# Patient Record
Sex: Female | Born: 1995
Health system: Southern US, Community
[De-identification: ages and names within clinical notes are randomized; demographics above are authoritative.]

## PROBLEM LIST (undated history)

## (undated) DIAGNOSIS — R109 Unspecified abdominal pain: Secondary | ICD-10-CM

## (undated) DIAGNOSIS — R0602 Shortness of breath: Secondary | ICD-10-CM

## (undated) DIAGNOSIS — R197 Diarrhea, unspecified: Secondary | ICD-10-CM

## (undated) DIAGNOSIS — R002 Palpitations: Secondary | ICD-10-CM

## (undated) DIAGNOSIS — R079 Chest pain, unspecified: Secondary | ICD-10-CM

## (undated) DIAGNOSIS — R011 Cardiac murmur, unspecified: Secondary | ICD-10-CM

## (undated) HISTORY — DX: Chest pain, unspecified: R07.9

## (undated) HISTORY — DX: Unspecified abdominal pain: R10.9

## (undated) HISTORY — DX: Diarrhea, unspecified: R19.7

## (undated) HISTORY — DX: Palpitations: R00.2

## (undated) HISTORY — DX: Shortness of breath: R06.02

## (undated) HISTORY — PX: CHOLECYSTECTOMY: SHX55

---
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
## 2015-12-21 DIAGNOSIS — J309 Allergic rhinitis, unspecified: Secondary | ICD-10-CM | POA: Diagnosis not present

## 2015-12-21 DIAGNOSIS — F411 Generalized anxiety disorder: Secondary | ICD-10-CM | POA: Diagnosis not present

## 2015-12-21 DIAGNOSIS — Z0000 Encounter for general adult medical examination without abnormal findings: Secondary | ICD-10-CM | POA: Diagnosis not present

## 2016-01-16 DIAGNOSIS — Z111 Encounter for screening for respiratory tuberculosis: Secondary | ICD-10-CM | POA: Diagnosis not present

## 2016-01-24 DIAGNOSIS — Z111 Encounter for screening for respiratory tuberculosis: Secondary | ICD-10-CM | POA: Diagnosis not present

## 2016-01-30 DIAGNOSIS — H5712 Ocular pain, left eye: Secondary | ICD-10-CM | POA: Diagnosis not present

## 2016-02-03 DIAGNOSIS — F411 Generalized anxiety disorder: Secondary | ICD-10-CM | POA: Diagnosis not present

## 2016-03-02 DIAGNOSIS — J209 Acute bronchitis, unspecified: Secondary | ICD-10-CM | POA: Diagnosis not present

## 2016-03-02 DIAGNOSIS — J40 Bronchitis, not specified as acute or chronic: Secondary | ICD-10-CM | POA: Diagnosis not present

## 2017-01-14 DIAGNOSIS — Z0000 Encounter for general adult medical examination without abnormal findings: Secondary | ICD-10-CM | POA: Diagnosis not present

## 2017-01-14 DIAGNOSIS — Z23 Encounter for immunization: Secondary | ICD-10-CM | POA: Diagnosis not present

## 2017-01-14 DIAGNOSIS — Z79899 Other long term (current) drug therapy: Secondary | ICD-10-CM | POA: Diagnosis not present

## 2018-02-04 DIAGNOSIS — L245 Irritant contact dermatitis due to other chemical products: Secondary | ICD-10-CM | POA: Diagnosis not present

## 2018-04-11 DIAGNOSIS — Z23 Encounter for immunization: Secondary | ICD-10-CM | POA: Diagnosis not present

## 2018-04-11 DIAGNOSIS — L2389 Allergic contact dermatitis due to other agents: Secondary | ICD-10-CM | POA: Diagnosis not present

## 2018-05-22 DIAGNOSIS — T7840XA Allergy, unspecified, initial encounter: Secondary | ICD-10-CM | POA: Diagnosis not present

## 2018-05-22 DIAGNOSIS — Z0000 Encounter for general adult medical examination without abnormal findings: Secondary | ICD-10-CM | POA: Diagnosis not present

## 2018-06-07 DIAGNOSIS — J209 Acute bronchitis, unspecified: Secondary | ICD-10-CM | POA: Diagnosis not present

## 2018-07-14 DIAGNOSIS — J309 Allergic rhinitis, unspecified: Secondary | ICD-10-CM | POA: Diagnosis not present

## 2018-07-14 DIAGNOSIS — R21 Rash and other nonspecific skin eruption: Secondary | ICD-10-CM | POA: Diagnosis not present

## 2018-07-14 DIAGNOSIS — R05 Cough: Secondary | ICD-10-CM | POA: Diagnosis not present

## 2018-07-24 DIAGNOSIS — T781XXA Other adverse food reactions, not elsewhere classified, initial encounter: Secondary | ICD-10-CM | POA: Diagnosis not present

## 2018-11-20 ENCOUNTER — Other Ambulatory Visit (HOSPITAL_COMMUNITY): Payer: Self-pay | Admitting: General Surgery

## 2018-11-20 DIAGNOSIS — K8080 Other cholelithiasis without obstruction: Secondary | ICD-10-CM

## 2018-11-20 DIAGNOSIS — K8020 Calculus of gallbladder without cholecystitis without obstruction: Secondary | ICD-10-CM | POA: Diagnosis not present

## 2018-12-02 ENCOUNTER — Other Ambulatory Visit: Payer: Self-pay

## 2018-12-02 ENCOUNTER — Encounter (HOSPITAL_COMMUNITY)
Admission: RE | Admit: 2018-12-02 | Discharge: 2018-12-02 | Disposition: A | Discharge status: Home / Self Care | Payer: BLUE CROSS/BLUE SHIELD | Source: Ambulatory Visit | Attending: General Surgery | Admitting: General Surgery

## 2018-12-02 ENCOUNTER — Ambulatory Visit (HOSPITAL_COMMUNITY)
Admission: RE | Admit: 2018-12-02 | Discharge: 2018-12-02 | Disposition: A | Discharge status: Home / Self Care | Payer: BLUE CROSS/BLUE SHIELD | Source: Ambulatory Visit | Attending: General Surgery | Admitting: General Surgery

## 2018-12-02 DIAGNOSIS — K8080 Other cholelithiasis without obstruction: Secondary | ICD-10-CM

## 2018-12-02 DIAGNOSIS — R110 Nausea: Secondary | ICD-10-CM | POA: Diagnosis not present

## 2018-12-02 DIAGNOSIS — K8050 Calculus of bile duct without cholangitis or cholecystitis without obstruction: Secondary | ICD-10-CM | POA: Diagnosis not present

## 2018-12-02 DIAGNOSIS — R140 Abdominal distension (gaseous): Secondary | ICD-10-CM | POA: Diagnosis not present

## 2018-12-02 MED ORDER — TECHNETIUM TC 99M MEBROFENIN IV KIT
5.0000 | PACK | Freq: Once | INTRAVENOUS | Status: AC | PRN
  Administered 2018-12-02: 5 via INTRAVENOUS

## 2018-12-04 ENCOUNTER — Ambulatory Visit: Payer: Self-pay | Admitting: General Surgery

## 2018-12-04 NOTE — H&P (Signed)
History of Present Illness Axel Filler(Teneka Malmberg MD; 11/20/2018 11:03 AM) The patient is a 23 year old female who presents for evaluation of gall stones. Chief Complaint: Abdominal pain  Patient is a 23 year old female otherwise healthy, who comes in with a one-year history of epigastric abdominal pain. Patient had issues with bloating, reflux, diarrhea. Patient states that usually epigastric abdominal pain begins after eating something high in fat. She states that she has recently changed her diet to a [employer] diet. She states this is helping somewhat. Patient states that the episodes of epigastric abdominal pain, nausea, diarrhea is associated with diaphoresis.  Patient has not had any previous imaging. Patient's had a previous abdominal surgery.    Past Surgical History Rosezella [place](Diane Herrin, RN; 11/20/2018 10:41 AM) Foot Surgery  Left. Oral Surgery   Diagnostic Studies History Rosezella [place](Diane Herrin, RN; 11/20/2018 10:41 AM) Colonoscopy  never Mammogram  never Pap Smear  never  Allergies Rosezella Florida(Diane Herrin, RN; 11/20/2018 10:44 AM) PCN-200 *MISCELLANEOUS THERAPEUTIC CLASSES*  Rash. Betadine *ANTISEPTICS & DISINFECTANTS*  Rash, Pruritus, Hives. and CHG as weel, patient is a scrub tech  Medication History (Diane Herrin, RN; 11/20/2018 10:45 AM) Sprintec 28 (0.25-35MG -MCG Tablet, Oral) Active. Lexapro (5MG /5ML Solution, Oral) Active. ZyrTEC Allergy (10MG  Capsule, Oral) Active. Medications Reconciled  Social History Rosezella Florida(Diane Herrin, RN; 11/20/2018 10:41 AM) Alcohol use  Occasional alcohol use. Caffeine use  Carbonated beverages. No drug use  Tobacco use  Never smoker.  Family History Rosezella Florida(Diane Herrin, RN; 11/20/2018 10:41 AM) Cancer  Father. Hypertension  Father.  Pregnancy / Birth History Rosezella Florida(Diane Herrin, RN; 11/20/2018 10:41 AM) Age at menarche  13 years. Contraceptive History  Oral contraceptives. Regular periods   Other Problems Rosezella Florida(Diane Herrin, RN; 11/20/2018 10:41 AM) Anxiety Disorder      Review of Systems Axel Filler(Xoie Kreuser MD; 11/20/2018 11:01 AM) General Present- Appetite Loss, Chills, Fatigue and Weight Loss. Not Present- Fever, Night Sweats and Weight Gain. Skin Not Present- Change in Wart/Mole, Dryness, Hives, Jaundice, New Lesions, Non-Healing Wounds, Rash and Ulcer. HEENT Not Present- Earache, Hearing Loss, Hoarseness, Nose Bleed, Oral Ulcers, Ringing in the Ears, Seasonal Allergies, Sinus Pain, Sore Throat, Visual Disturbances, Wears glasses/contact lenses and Yellow Eyes. Respiratory Not Present- Bloody sputum, Chronic Cough, Difficulty Breathing, Snoring and Wheezing. Breast Not Present- Breast Mass, Breast Pain, Nipple Discharge and Skin Changes. Cardiovascular Present- Rapid Heart Rate. Not Present- Chest Pain, Difficulty Breathing Lying Down, Leg Cramps, Palpitations, Shortness of Breath and Swelling of Extremities. Gastrointestinal Present- Abdominal Pain, Bloating, Change in Bowel Habits, Excessive gas, Gets full quickly at meals, Indigestion, Nausea and Vomiting. Not Present- Bloody Stool, Chronic diarrhea, Constipation, Difficulty Swallowing, Hemorrhoids and Rectal Pain. Female Genitourinary Not Present- Frequency, Nocturia, Painful Urination, Pelvic Pain and Urgency. Musculoskeletal Not Present- Back Pain, Joint Pain, Joint Stiffness, Muscle Pain, Muscle Weakness and Swelling of Extremities. Neurological Not Present- Decreased Memory, Fainting, Headaches, Numbness, Seizures, Tingling, Tremor, Trouble walking and Weakness. Psychiatric Not Present- Anxiety, Bipolar, Change in Sleep Pattern, Depression, Fearful and Frequent crying. Endocrine Not Present- Cold Intolerance, Excessive Hunger, Hair Changes, Heat Intolerance, Hot flashes and New Diabetes. Hematology Not Present- Blood Thinners, Easy Bruising, Excessive bleeding, Gland problems, HIV and Persistent Infections. All other systems negative  Vitals (Diane Herrin RN; 11/20/2018 10:45 AM) 11/20/2018 10:45  AM Weight: 145.38 lb Height: 65in Body Surface Area: 1.73 m Body Mass Index: 24.19 kg/m  Temp.: 98.10F(Oral)  Pulse: 78 (Regular)  P.OX: 98% (Room air) BP: 112/66 (Sitting, Left Arm, Standard)       Physical Exam Axel Filler(Totiana Everson MD;  11/20/2018 11:03 AM) The physical exam findings are as follows: Note:Constitutional: No acute distress, conversant, appears stated age  Eyes: Anicteric sclerae, moist conjunctiva, no lid lag  Neck: No thyromegaly, trachea midline, no cervical lymphadenopathy  Lungs: Clear to auscultation biilaterally, normal respiratory effot  Cardiovascular: regular rate & rhythm, no murmurs, no peripheal edema, pedal pulses 2+  GI: Soft, no masses or hepatosplenomegaly, non-tender to palpation  MSK: Normal gait, no clubbing cyanosis, edema  Skin: No rashes, palpation reveals normal skin turgor  Psychiatric: Appropriate judgment and insight, oriented to person, place, and time    Assessment & Plan Axel Filler MD; 11/20/2018 11:04 AM) SYMPTOMATIC CHOLELITHIASIS (K80.20) Impression: 23 year old female with likely symptomatic cholelithiasis versus biliary dyskinesia. 1. Will order a HIDA scan ultrasound. We'll call her back with the results. 2. I did discuss with her the surgery in detail. 3. Risks and benefits were discussed with the patient to generally include, but not limited to: infection, bleeding, possible need for post op ERCP, damage to the bile ducts, bile leak, and possible need for further surgery. Alternatives were offered and described. All questions were answered and the patient voiced understanding of the procedure and wishes to proceed at this point with a laparoscopic cholecystectomy

## 2019-01-08 DIAGNOSIS — Z1159 Encounter for screening for other viral diseases: Secondary | ICD-10-CM | POA: Diagnosis not present

## 2019-01-13 DIAGNOSIS — K811 Chronic cholecystitis: Secondary | ICD-10-CM | POA: Diagnosis not present

## 2019-01-13 DIAGNOSIS — K828 Other specified diseases of gallbladder: Secondary | ICD-10-CM | POA: Diagnosis not present

## 2019-03-02 DIAGNOSIS — H6123 Impacted cerumen, bilateral: Secondary | ICD-10-CM | POA: Diagnosis not present

## 2019-04-23 DIAGNOSIS — Z01419 Encounter for gynecological examination (general) (routine) without abnormal findings: Secondary | ICD-10-CM | POA: Diagnosis not present

## 2019-04-23 DIAGNOSIS — Z6824 Body mass index (BMI) 24.0-24.9, adult: Secondary | ICD-10-CM | POA: Diagnosis not present

## 2019-04-23 DIAGNOSIS — Z113 Encounter for screening for infections with a predominantly sexual mode of transmission: Secondary | ICD-10-CM | POA: Diagnosis not present

## 2019-06-09 DIAGNOSIS — Z79899 Other long term (current) drug therapy: Secondary | ICD-10-CM | POA: Diagnosis not present

## 2019-06-09 DIAGNOSIS — Z0000 Encounter for general adult medical examination without abnormal findings: Secondary | ICD-10-CM | POA: Diagnosis not present

## 2019-06-09 DIAGNOSIS — Z13220 Encounter for screening for lipoid disorders: Secondary | ICD-10-CM | POA: Diagnosis not present

## 2019-08-12 DIAGNOSIS — Z3181 Encounter for male factor infertility in female patient: Secondary | ICD-10-CM | POA: Diagnosis not present

## 2019-08-12 DIAGNOSIS — N978 Female infertility of other origin: Secondary | ICD-10-CM | POA: Diagnosis not present

## 2019-08-20 DIAGNOSIS — Z3181 Encounter for male factor infertility in female patient: Secondary | ICD-10-CM | POA: Diagnosis not present

## 2019-08-20 DIAGNOSIS — N978 Female infertility of other origin: Secondary | ICD-10-CM | POA: Diagnosis not present

## 2019-11-24 DIAGNOSIS — L858 Other specified epidermal thickening: Secondary | ICD-10-CM | POA: Diagnosis not present

## 2019-11-24 DIAGNOSIS — D485 Neoplasm of uncertain behavior of skin: Secondary | ICD-10-CM | POA: Diagnosis not present

## 2019-11-24 DIAGNOSIS — D2262 Melanocytic nevi of left upper limb, including shoulder: Secondary | ICD-10-CM | POA: Diagnosis not present

## 2019-11-24 DIAGNOSIS — D225 Melanocytic nevi of trunk: Secondary | ICD-10-CM | POA: Diagnosis not present

## 2019-11-24 DIAGNOSIS — D2261 Melanocytic nevi of right upper limb, including shoulder: Secondary | ICD-10-CM | POA: Diagnosis not present

## 2019-11-24 DIAGNOSIS — D224 Melanocytic nevi of scalp and neck: Secondary | ICD-10-CM | POA: Diagnosis not present

## 2020-01-13 DIAGNOSIS — R110 Nausea: Secondary | ICD-10-CM | POA: Diagnosis not present

## 2020-01-13 DIAGNOSIS — R197 Diarrhea, unspecified: Secondary | ICD-10-CM | POA: Diagnosis not present

## 2020-01-13 DIAGNOSIS — K529 Noninfective gastroenteritis and colitis, unspecified: Secondary | ICD-10-CM | POA: Diagnosis not present

## 2020-02-12 DIAGNOSIS — Z3169 Encounter for other general counseling and advice on procreation: Secondary | ICD-10-CM | POA: Diagnosis not present

## 2020-03-06 ENCOUNTER — Encounter (HOSPITAL_COMMUNITY): Payer: Self-pay | Admitting: Emergency Medicine

## 2020-03-06 ENCOUNTER — Emergency Department (HOSPITAL_COMMUNITY)
Admission: EM | Admit: 2020-03-06 | Discharge: 2020-03-06 | Disposition: A | Discharge status: Home / Self Care | Payer: BC Managed Care – PPO | Attending: Emergency Medicine | Admitting: Emergency Medicine

## 2020-03-06 ENCOUNTER — Emergency Department (HOSPITAL_COMMUNITY): Payer: BC Managed Care – PPO

## 2020-03-06 ENCOUNTER — Other Ambulatory Visit: Payer: Self-pay

## 2020-03-06 DIAGNOSIS — R002 Palpitations: Secondary | ICD-10-CM | POA: Diagnosis not present

## 2020-03-06 DIAGNOSIS — R109 Unspecified abdominal pain: Secondary | ICD-10-CM | POA: Insufficient documentation

## 2020-03-06 DIAGNOSIS — R197 Diarrhea, unspecified: Secondary | ICD-10-CM | POA: Diagnosis not present

## 2020-03-06 DIAGNOSIS — R0789 Other chest pain: Secondary | ICD-10-CM | POA: Diagnosis not present

## 2020-03-06 DIAGNOSIS — R079 Chest pain, unspecified: Secondary | ICD-10-CM | POA: Diagnosis not present

## 2020-03-06 HISTORY — DX: Cardiac murmur, unspecified: R01.1

## 2020-03-06 LAB — BASIC METABOLIC PANEL
Anion gap: 10 (ref 5–15)
BUN: 5 mg/dL — ABNORMAL LOW (ref 6–20)
CO2: 23 mmol/L (ref 22–32)
Calcium: 9.4 mg/dL (ref 8.9–10.3)
Chloride: 104 mmol/L (ref 98–111)
Creatinine, Ser: 0.79 mg/dL (ref 0.44–1.00)
GFR calc Af Amer: >60 mL/min (ref >60)
GFR calc non Af Amer: >60 mL/min (ref >60)
Glucose, Bld: 83 mg/dL (ref 70–99)
Potassium: 4.1 mmol/L (ref 3.5–5.1)
Sodium: 137 mmol/L (ref 135–145)

## 2020-03-06 LAB — CBC
HCT: 42.5 % (ref 36.0–46.0)
Hemoglobin: 13.4 g/dL (ref 12.0–15.0)
MCH: 28.8 pg (ref 26.0–34.0)
MCHC: 31.5 g/dL (ref 30.0–36.0)
MCV: 91.2 fL (ref 80.0–100.0)
Platelets: 309 10*3/uL (ref 150–400)
RBC: 4.66 MIL/uL (ref 3.87–5.11)
RDW: 12.9 % (ref 11.5–15.5)
WBC: 7.4 10*3/uL (ref 4.0–10.5)
nRBC: 0 % (ref 0.0–0.2)

## 2020-03-06 LAB — D-DIMER, QUANTITATIVE: D-Dimer, Quant: <0.27 ug/mL-FEU (ref 0.00–0.50)

## 2020-03-06 LAB — TROPONIN I (HIGH SENSITIVITY)
Troponin I (High Sensitivity): <2 ng/L (ref <18)
Troponin I (High Sensitivity): 2 ng/L (ref <18)

## 2020-03-06 LAB — I-STAT BETA HCG BLOOD, ED (MC, WL, AP ONLY): I-stat hCG, quantitative: <5 m[IU]/mL (ref <5)

## 2020-03-06 LAB — TSH: TSH: 2.726 u[IU]/mL (ref 0.350–4.500)

## 2020-03-06 NOTE — ED Triage Notes (Signed)
Pt states her heart started racing while in church, chest tightness, palms were sweaty, nausea, and diarrhea.  Received 1st COVID vaccine on Tuesday.  She put her husband's apple watch on and HR was fluctuating between 70-140.

## 2020-03-06 NOTE — Discharge Instructions (Addendum)
You were evaluated in the Emergency Department and after careful evaluation, we did not find any emergent condition requiring admission or further testing in the hospital.  Your exam/testing today is overall reassuring.  As discussed, if your symptoms continue we recommend follow-up with cardiology for further recommendations and possibly more extended cardiac monitoring.  Please call the office number provided to schedule an appointment.  Please return to the Emergency Department if you experience any worsening of your condition.   Thank you for allowing [place] to be a part of your care.

## 2020-03-06 NOTE — ED Provider Notes (Signed)
[facility] Emergency Department Provider Note MRN:  [id number]  Arrival date & time: 03/06/20     Chief Complaint   heart racing   History of Present Illness   Angela Rogers is a 24 y.o. year-old female with no pertinent past medical history presenting to the ED with chief complaint of heart racing.  Patient was at church this morning when she experienced sudden onset palpitations, chest pain, abdominal cramping, followed by diarrhea.  She continues to have on and off palpitations and chest discomfort with shortness of breath.  Denies headache or vision change, no recent fever or cough, no lower abdominal pain, no vaginal bleeding or discharge, no numbness or weakness to the arms or legs.  Review of Systems  A complete 10 system review of systems was obtained and all systems are negative except as noted in the HPI and PMH.   Patient's Health History    Past Medical History:  Diagnosis Date  . Heart murmur     Past Surgical History:  Procedure Laterality Date  . CHOLECYSTECTOMY      No family history on file.  Social History   Socioeconomic History  . Marital status: Single    Spouse name: Not on file  . Number of children: Not on file  . Years of education: Not on file  . Highest education level: Not on file  Occupational History  . Not on file  Tobacco Use  . Smoking status: Never Smoker  . Smokeless tobacco: Never Used  Substance and Sexual Activity  . Alcohol use: Never  . Drug use: Never  . Sexual activity: Not on file  Other Topics Concern  . Not on file  Social History Narrative  . Not on file   Social Determinants of Health   Financial Resource Strain:   . Difficulty of Paying Living Expenses: Not on file  Food Insecurity:   . Worried About Programme researcher, broadcasting/film/video in the Last Year: Not on file  . Ran Out of Food in the Last Year: Not on file  Transportation Needs:   . Lack of Transportation (Medical): Not on file  . Lack  of Transportation (Non-Medical): Not on file  Physical Activity:   . Days of Exercise per Week: Not on file  . Minutes of Exercise per Session: Not on file  Stress:   . Feeling of Stress : Not on file  Social Connections:   . Frequency of Communication with Friends and Family: Not on file  . Frequency of Social Gatherings with Friends and Family: Not on file  . Attends Religious Services: Not on file  . Active Member of Clubs or Organizations: Not on file  . Attends Banker Meetings: Not on file  . Marital Status: Not on file  Intimate Partner Violence:   . Fear of Current or Ex-Partner: Not on file  . Emotionally Abused: Not on file  . Physically Abused: Not on file  . Sexually Abused: Not on file     Physical Exam   Vitals:   03/06/20 1808 03/06/20 1930  BP: 127/63 (!) 106/94  Pulse: 77 75  Resp: 19 (!) 23  Temp: 98.3 F (36.8 C)   SpO2: 100% 100%    CONSTITUTIONAL: Well-appearing, NAD NEURO:  Alert and oriented x 3, no focal deficits EYES:  eyes equal and reactive ENT/NECK:  no LAD, no JVD CARDIO: Regular rate, well-perfused, normal S1 and S2 PULM:  CTAB no wheezing or rhonchi GI/GU:  normal bowel sounds, non-distended, non-tender MSK/SPINE:  No gross deformities, no edema SKIN:  no rash, atraumatic PSYCH:  Appropriate speech and behavior  *Additional and/or pertinent findings included in MDM below  Diagnostic and Interventional Summary    EKG Interpretation  Date/Time:  Sunday March 06 2020 12:33:00 EDT Ventricular Rate:  94 PR Interval:  150 QRS Duration: 88 QT Interval:  362 QTC Calculation: 452 R Axis:   78 Text Interpretation: Normal sinus rhythm Normal ECG No significant change was found Confirmed by Kennis Carina [phone]) on 03/06/2020 7:44:20 PM      Labs Reviewed  BASIC METABOLIC PANEL - Abnormal; Notable for the following components:      Result Value   BUN 5 (*)    All other components within normal limits  CBC  D-DIMER,  QUANTITATIVE (NOT AT [facility])  TSH  I-STAT BETA HCG BLOOD, ED ([facility], [facility], [facility] ONLY)  TROPONIN I (HIGH SENSITIVITY)  TROPONIN I (HIGH SENSITIVITY)    DG Chest 2 View  Final Result      Medications - No data to display   Procedures  /  Critical Care Procedures  ED Course and Medical Decision Making  I have reviewed the triage vital signs, the nursing notes, and pertinent available records from the EMR.  Listed above are laboratory and imaging tests that I personally ordered, reviewed, and interpreted and then considered in my medical decision making (see below for details).  Palpitations, chest pain.  Atypical, otherwise healthy female.  Takes birth control pills, recent flight to [place].  Will screen with D-dimer.  D-dimer negative, TSH normal, she continues to look well with no acute distress, normal vital signs, no arrhythmia on cardiac monitoring.  She is appropriate for discharge, advised cardiology follow-up if symptoms continue.       Elmer Sow. Pilar Plate, MD [facility] Emergency Medicine [facility] [email or web address]  Final Clinical Impressions(s) / ED Diagnoses     ICD-10-CM   1. Chest pain, unspecified type  R07.9   2. Palpitations  R00.2     ED Discharge Orders    None       Discharge Instructions Discussed with and Provided to Patient:     Discharge Instructions     You were evaluated in the Emergency Department and after careful evaluation, we did not find any emergent condition requiring admission or further testing in the hospital.  Your exam/testing today is overall reassuring.  As discussed, if your symptoms continue we recommend follow-up with cardiology for further recommendations and possibly more extended cardiac monitoring.  Please call the office number provided to schedule an appointment.  Please return to the Emergency Department if you experience any worsening of your condition.   Thank you for allowing [place] to be a part of your  care.       Sabas Sous, MD 03/06/20 2241

## 2020-03-17 NOTE — Progress Notes (Signed)
Cardiology Office Note:    Date:  03/18/2020   ID:  Raford Pitcher, DOB 20-May-1996, MRN [id number]  PCP:  Lupita Raider, MD  Cardiologist:  No primary care provider on file.   Referring MD: Lupita Raider, MD   Chief Complaint  Patient presents with  . Irregular Heart Beat    racing heart  . Advice Only    Near syncope    History of Present Illness:    Angela Rogers is a 24 y.o. female with a hx of chest pain for clinical and diagnostic evaluation.  On the last Sunday in office she started feeling odd at church.  She could feel her heart racing, developed nausea, diaphoresis, and felt generally bad.  She left the sanctuary followed by her parents.  They noted increases in the heart rate with sudden decreases in heart rate.  She ultimately developed nausea and an urge to defecate followed by diarrhea.  The heart rate continued to spike up and down.  She was eventually seen in the emergency room at [facility].  Significant blood work and monitoring was done without finding a particular answer.  TSH was normal, D-dimer was normal, O2 saturations were normal.  Past Medical History:  Diagnosis Date  . Abdominal cramping   . Chest pain   . Diarrhea   . Heart murmur   . Palpitations   . SOB (shortness of breath)     Past Surgical History:  Procedure Laterality Date  . CHOLECYSTECTOMY      Current Medications: Current Meds  Medication Sig  . cetirizine (ZYRTEC) 10 MG tablet Take 10 mg by mouth daily as needed for allergies.  Marland Kitchen escitalopram (LEXAPRO) 5 MG tablet Take 5 mg by mouth at bedtime.     Allergies:   Penicillins, Chlorhexidine, and Povidone iodine   Social History   Socioeconomic History  . Marital status: Single    Spouse name: Not on file  . Number of children: Not on file  . Years of education: Not on file  . Highest education level: Not on file  Occupational History  . Not on file  Tobacco Use  . Smoking status: Never Smoker  .  Smokeless tobacco: Never Used  Substance and Sexual Activity  . Alcohol use: Never  . Drug use: Never  . Sexual activity: Not on file  Other Topics Concern  . Not on file  Social History Narrative  . Not on file   Social Determinants of Health   Financial Resource Strain:   . Difficulty of Paying Living Expenses: Not on file  Food Insecurity:   . Worried About Programme researcher, broadcasting/film/video in the Last Year: Not on file  . Ran Out of Food in the Last Year: Not on file  Transportation Needs:   . Lack of Transportation (Medical): Not on file  . Lack of Transportation (Non-Medical): Not on file  Physical Activity:   . Days of Exercise per Week: Not on file  . Minutes of Exercise per Session: Not on file  Stress:   . Feeling of Stress : Not on file  Social Connections:   . Frequency of Communication with Friends and Family: Not on file  . Frequency of Social Gatherings with Friends and Family: Not on file  . Attends Religious Services: Not on file  . Active Member of Clubs or Organizations: Not on file  . Attends Banker Meetings: Not on file  . Marital Status: Not on file  Family History: The patient's family history is not on file.  ROS:   Please see the history of present illness.    Anxious about her current symptoms and problems.  She is worried about (explanation.  P.  All other systems reviewed and are negative.  EKGs/Labs/Other Studies Reviewed:    The following studies were reviewed today: All data from the [facility] Emergency room visit was reviewed.  TSH was normal.  D-dimer was normal.  Hemoglobin is normal.  Troponin Is normal as is kidney function and potassium.  EKG:  EKG electrocardiograms done 03/06/2020 on presentation to the ER revealed incomplete right bundle and sinus rhythm.  Recent Labs: 03/06/2020: BUN 5; Creatinine, Ser 0.79; Hemoglobin 13.4; Platelets 309; Potassium 4.1; Sodium 137; TSH 2.726  Recent Lipid Panel No results found for: CHOL,  TRIG, HDL, CHOLHDL, VLDL, LDLCALC, LDLDIRECT  Physical Exam:    VS:  BP 110/72   Pulse 79   Ht 5\' 6"  (1.676 m)   Wt 153 lb (69.4 kg)   LMP 03/02/2020   SpO2 99%   BMI 24.69 kg/m     Wt Readings from Last 3 Encounters:  03/18/20 153 lb (69.4 kg)     GEN: Appears healthy. No acute distress HEENT: Normal NECK: No JVD. LYMPHATICS: No lymphadenopathy CARDIAC: 1-2 6 right upper sternal systolic murmur.  No diastolic murmur.  RRR with standing no murmur, gallop, or edema. VASCULAR:  Normal Pulses. No bruits. RESPIRATORY:  Clear to auscultation without rales, wheezing or rhonchi  ABDOMEN: Soft, non-tender, non-distended, No pulsatile mass, MUSCULOSKELETAL: No deformity  SKIN: Warm and dry NEUROLOGIC:  Alert and oriented x 3 PSYCHIATRIC:  Normal affect   ASSESSMENT:    1. Atrial tachycardia (HCC)   2. Murmur   3. Near syncope   4. Educated about COVID-19 virus infection   5. Palpitations    PLAN:    In order of problems listed above:  1. 30-day monitor to rule out A. fib, PSVT, or other significant arrhythmia.  Hyperthyroidism has been excluded.  Consider pheochromocytoma if appropriate when additional database has been collected. 2. 2D Doppler echocardiogram to investigate the structural integrity of the heart 3. Will see if the Mylanta has any correlation with spells of near fainting that she is having.  Possibly a neurally mediated mechanism given the GI component. 4. Advised her to get the second dose of the Pfizer COVID-19 mRNA vaccine.   Medication Adjustments/Labs and Tests Ordered: Current medicines are reviewed at length with the patient today.  Concerns regarding medicines are outlined above.  Orders Placed This Encounter  Procedures  . Cardiac event monitor  . ECHOCARDIOGRAM COMPLETE   No orders of the defined types were placed in this encounter.   Patient Instructions  Medication Instructions:  Your physician recommends that you continue on your current  medications as directed. Please refer to the Current Medication list given to you today.  *If you need a refill on your cardiac medications before your next appointment, please call your pharmacy*   Lab Work: None If you have labs (blood work) drawn today and your tests are completely normal, you will receive your results only by: 05/18/20 MyChart Message (if you have MyChart) OR . A paper copy in the mail If you have any lab test that is abnormal or we need to change your treatment, we will call you to review the results.   Testing/Procedures: Your physician has requested that you have an echocardiogram. Echocardiography is a painless test that  uses sound waves to create images of your heart. It provides your doctor with information about the size and shape of your heart and how well your heart's chambers and valves are working. This procedure takes approximately one hour. There are no restrictions for this procedure.  Your physician has recommended that you wear an event monitor. Event monitors are medical devices that record the heart's electrical activity. Doctors most often [place] these monitors to diagnose arrhythmias. Arrhythmias are problems with the speed or rhythm of the heartbeat. The monitor is a small, portable device. You can wear one while you do your normal daily activities. This is usually used to diagnose what is causing palpitations/syncope (passing out).    Follow-Up: At [facility], you and your health needs are our priority.  As part of our continuing mission to provide you with exceptional heart care, we have created designated Provider Care Teams.  These Care Teams include your primary Cardiologist (physician) and Advanced Practice Providers (APPs -  Physician Assistants and Nurse Practitioners) who all work together to provide you with the care you need, when you need it.  We recommend signing up for the patient portal called "MyChart".  Sign up information is provided on this  After Visit Summary.  MyChart is used to connect with patients for Virtual Visits (Telemedicine).  Patients are able to view lab/test results, encounter notes, upcoming appointments, etc.  Non-urgent messages can be sent to your provider as well.   To learn more about what you can do with MyChart, go to [email or web address].    Your next appointment:   As needed  The format for your next appointment:   In Person  Provider:   You may see Dr. Verdis Prime or one of the following Advanced Practice Providers on your designated Care Team:    Norma Fredrickson, NP  Nada Boozer, NP  Georgie Chard, NP    Other Instructions  [employer] Cardiac Event Monitor Instructions Your physician has requested you wear your cardiac event monitor for 30 days. [employer] may call or text to confirm a shipping address. The monitor will be sent to a land address via UPS. [employer] will not ship a monitor to a PO BOX. It typically takes 3-5 days to receive your monitor after it has been enrolled. [employer] will assist with USPS tracking if your package is delayed. The telephone number for [employer] is [phone]. Once you have received your monitor, please review the enclosed instructions. Instruction tutorials can also be viewed under help and settings on the enclosed cell phone. Your monitor has already been registered assigning a specific monitor serial # to you.  Applying the monitor Remove cell phone from case and turn it on. The cell phone works as IT consultant and needs to be within [employer] of you at all times. The cell phone will need to be charged on a daily basis. We recommend you plug the cell phone into the enclosed charger at your bedside table every night.  Monitor batteries: You will receive two monitor batteries labelled #1 and #2. These are your recorders. Plug battery #2 onto the second connection on the enclosed charger. Keep one battery on the charger at all times. This  will keep the monitor battery deactivated. It will also keep it fully charged for when you need to switch your monitor batteries. A small light will be blinking on the battery emblem when it is charging. The light on the battery emblem will remain on when the battery is fully  charged.  Open package of a Monitor strip. Insert battery #1 into black hood on strip and gently squeeze monitor battery onto connection as indicated in instruction booklet. Set aside while preparing skin.  Choose location for your strip, vertical or horizontal, as indicated in the instruction booklet. Shave to remove all hair from location. There cannot be any lotions, oils, powders, or colognes on skin where monitor is to be applied. Wipe skin clean with enclosed Saline wipe. Dry skin completely.  Peel paper labeled #1 off the back of the Monitor strip exposing the adhesive. Place the monitor on the chest in the vertical or horizontal position shown in the instruction booklet. One arrow on the monitor strip must be pointing upward. Carefully remove paper labeled #2, attaching remainder of strip to your skin. Try not to create any folds or wrinkles in the strip as you apply it.  Firmly press and release the circle in the center of the monitor battery. You will hear a small beep. This is turning the monitor battery on. The heart emblem on the monitor battery will light up every 5 seconds if the monitor battery in turned on and connected to the patient securely. Do not push and hold the circle down as this turns the monitor battery off. The cell phone will locate the monitor battery. A screen will appear on the cell phone checking the connection of your monitor strip. This may read poor connection initially but change to good connection within the next minute. Once your monitor accepts the connection you will hear a series of 3 beeps followed by a climbing crescendo of beeps. A screen will appear on the cell phone showing  the two monitor strip placement options. Touch the picture that demonstrates where you applied the monitor strip.  Your monitor strip and battery are waterproof. You are able to shower, bathe, or swim with the monitor on. They just ask you do not submerge deeper than 3 feet underwater. We recommend removing the monitor if you are swimming in a lake, river, or ocean.  Your monitor battery will need to be switched to a fully charged monitor battery approximately once a week. The cell phone will alert you of an action which needs to be made.  On the cell phone, tap for details to reveal connection status, monitor battery status, and cell phone battery status. The green dots indicates your monitor is in good status. A red dot indicates there is something that needs your attention.  To record a symptom, click the circle on the monitor battery. In 30-60 seconds a list of symptoms will appear on the cell phone. Select your symptom and tap save. Your monitor will record a sustained or significant arrhythmia regardless of you clicking the button. Some patients do not feel the heart rhythm irregularities. [employer] will notify us of any serious or critical events.  Refer to instruction booklet for instructions on switching batteries, changing strips, the Do not disturb or Pause features, or any additional questions.  Call [employer] at [phone], to confirm your monitor is transmitting and record your baseline. They will answer any questions you may have regarding the monitor instructions at that time.  Returning the monitor to [employer] Place all equipment back into blue box. Peel off strip of paper to expose adhesive and close box securely. There is a prepaid UPS shipping label on this box. Drop in a UPS drop box, or at a UPS facility like [employer]. You may also contact [employer] to arrange UPS  to pick up monitor package at your home.     Signed, Lesleigh Noe, MD  03/18/2020 5:29  PM    [facility]

## 2020-03-18 ENCOUNTER — Ambulatory Visit: Payer: BC Managed Care – PPO | Admitting: Interventional Cardiology

## 2020-03-18 ENCOUNTER — Other Ambulatory Visit: Payer: Self-pay

## 2020-03-18 ENCOUNTER — Encounter: Payer: Self-pay | Admitting: Interventional Cardiology

## 2020-03-18 ENCOUNTER — Encounter: Payer: Self-pay | Admitting: *Deleted

## 2020-03-18 ENCOUNTER — Telehealth: Payer: Self-pay | Admitting: Radiology

## 2020-03-18 VITALS — BP 110/72 | HR 79 | Ht 66.0 in | Wt 153.0 lb

## 2020-03-18 DIAGNOSIS — R011 Cardiac murmur, unspecified: Secondary | ICD-10-CM

## 2020-03-18 DIAGNOSIS — Z7189 Other specified counseling: Secondary | ICD-10-CM

## 2020-03-18 DIAGNOSIS — I471 Supraventricular tachycardia: Secondary | ICD-10-CM | POA: Diagnosis not present

## 2020-03-18 DIAGNOSIS — R55 Syncope and collapse: Secondary | ICD-10-CM

## 2020-03-18 DIAGNOSIS — R002 Palpitations: Secondary | ICD-10-CM

## 2020-03-18 NOTE — Patient Instructions (Signed)
Medication Instructions:  Your physician recommends that you continue on your current medications as directed. Please refer to the Current Medication list given to you today.  *If you need a refill on your cardiac medications before your next appointment, please call your pharmacy*   Lab Work: None If you have labs (blood work) drawn today and your tests are completely normal, you will receive your results only by: Marland Kitchen MyChart Message (if you have MyChart) OR . A paper copy in the mail If you have any lab test that is abnormal or we need to change your treatment, we will call you to review the results.   Testing/Procedures: Your physician has requested that you have an echocardiogram. Echocardiography is a painless test that uses sound waves to create images of your heart. It provides your doctor with information about the size and shape of your heart and how well your heart's chambers and valves are working. This procedure takes approximately one hour. There are no restrictions for this procedure.  Your physician has recommended that you wear an event monitor. Event monitors are medical devices that record the heart's electrical activity. Doctors most often [place] these monitors to diagnose arrhythmias. Arrhythmias are problems with the speed or rhythm of the heartbeat. The monitor is a small, portable device. You can wear one while you do your normal daily activities. This is usually used to diagnose what is causing palpitations/syncope (passing out).    Follow-Up: At [facility], you and your health needs are our priority.  As part of our continuing mission to provide you with exceptional heart care, we have created designated Provider Care Teams.  These Care Teams include your primary Cardiologist (physician) and Advanced Practice Providers (APPs -  Physician Assistants and Nurse Practitioners) who all work together to provide you with the care you need, when you need it.  We recommend  signing up for the patient portal called "MyChart".  Sign up information is provided on this After Visit Summary.  MyChart is used to connect with patients for Virtual Visits (Telemedicine).  Patients are able to view lab/test results, encounter notes, upcoming appointments, etc.  Non-urgent messages can be sent to your provider as well.   To learn more about what you can do with MyChart, go to [email or web address].    Your next appointment:   As needed  The format for your next appointment:   In Person  Provider:   You may see Dr. Verdis Prime or one of the following Advanced Practice Providers on your designated Care Team:    Norma Fredrickson, NP  Nada Boozer, NP  Georgie Chard, NP    Other Instructions  [employer] Cardiac Event Monitor Instructions Your physician has requested you wear your cardiac event monitor for 30 days. [employer] may call or text to confirm a shipping address. The monitor will be sent to a land address via UPS. [employer] will not ship a monitor to a PO BOX. It typically takes 3-5 days to receive your monitor after it has been enrolled. [employer] will assist with USPS tracking if your package is delayed. The telephone number for [employer] is [phone]. Once you have received your monitor, please review the enclosed instructions. Instruction tutorials can also be viewed under help and settings on the enclosed cell phone. Your monitor has already been registered assigning a specific monitor serial # to you.  Applying the monitor Remove cell phone from case and turn it on. The cell phone works as IT consultant and needs to  be within 10 feet of you at all times. The cell phone will need to be charged on a daily basis. We recommend you plug the cell phone into the enclosed charger at your bedside table every night.  Monitor batteries: You will receive two monitor batteries labelled #1 and #2. These are your recorders. Plug battery #2 onto the  second connection on the enclosed charger. Keep one battery on the charger at all times. This will keep the monitor battery deactivated. It will also keep it fully charged for when you need to switch your monitor batteries. A small light will be blinking on the battery emblem when it is charging. The light on the battery emblem will remain on when the battery is fully charged.  Open package of a Monitor strip. Insert battery #1 into black hood on strip and gently squeeze monitor battery onto connection as indicated in instruction booklet. Set aside while preparing skin.  Choose location for your strip, vertical or horizontal, as indicated in the instruction booklet. Shave to remove all hair from location. There cannot be any lotions, oils, powders, or colognes on skin where monitor is to be applied. Wipe skin clean with enclosed Saline wipe. Dry skin completely.  Peel paper labeled #1 off the back of the Monitor strip exposing the adhesive. Place the monitor on the chest in the vertical or horizontal position shown in the instruction booklet. One arrow on the monitor strip must be pointing upward. Carefully remove paper labeled #2, attaching remainder of strip to your skin. Try not to create any folds or wrinkles in the strip as you apply it.  Firmly press and release the circle in the center of the monitor battery. You will hear a small beep. This is turning the monitor battery on. The heart emblem on the monitor battery will light up every 5 seconds if the monitor battery in turned on and connected to the patient securely. Do not push and hold the circle down as this turns the monitor battery off. The cell phone will locate the monitor battery. A screen will appear on the cell phone checking the connection of your monitor strip. This may read poor connection initially but change to good connection within the next minute. Once your monitor accepts the connection you will hear a series of  3 beeps followed by a climbing crescendo of beeps. A screen will appear on the cell phone showing the two monitor strip placement options. Touch the picture that demonstrates where you applied the monitor strip.  Your monitor strip and battery are waterproof. You are able to shower, bathe, or swim with the monitor on. They just ask you do not submerge deeper than 3 feet underwater. We recommend removing the monitor if you are swimming in a lake, river, or ocean.  Your monitor battery will need to be switched to a fully charged monitor battery approximately once a week. The cell phone will alert you of an action which needs to be made.  On the cell phone, tap for details to reveal connection status, monitor battery status, and cell phone battery status. The green dots indicates your monitor is in good status. A red dot indicates there is something that needs your attention.  To record a symptom, click the circle on the monitor battery. In 30-60 seconds a list of symptoms will appear on the cell phone. Select your symptom and tap save. Your monitor will record a sustained or significant arrhythmia regardless of you clicking the button. Some  patients do not feel the heart rhythm irregularities. [employer] will notify us of any serious or critical events.  Refer to instruction booklet for instructions on switching batteries, changing strips, the Do not disturb or Pause features, or any additional questions.  Call [employer] at [phone], to confirm your monitor is transmitting and record your baseline. They will answer any questions you may have regarding the monitor instructions at that time.  Returning the monitor to [employer] Place all equipment back into blue box. Peel off strip of paper to expose adhesive and close box securely. There is a prepaid UPS shipping label on this box. Drop in a UPS drop box, or at a UPS facility like [employer]. You may also contact [employer] to arrange  UPS to pick up monitor package at your home.

## 2020-03-18 NOTE — Telephone Encounter (Signed)
Enrolled patient for a 30 day [employer] Event Monitor to be mailed to patients home  

## 2020-03-31 ENCOUNTER — Other Ambulatory Visit (HOSPITAL_COMMUNITY): Payer: BC Managed Care – PPO

## 2020-04-05 ENCOUNTER — Telehealth: Payer: Self-pay | Admitting: Interventional Cardiology

## 2020-04-05 NOTE — Telephone Encounter (Signed)
Returned call to patient she has had skin irritation and issues with her monitor. [employer] just sent her a new device yesterday. I was able to speak with our Rep Marthann Schiller who cancelled her original study and she will start her 30 days over with the new device she just received. I will also leave a different type of sensitive skin electrodes at our front desk for her to try.

## 2020-04-05 NOTE — Telephone Encounter (Signed)
Is there any way to get this extended since she hasn't been wearing it due to skin irritation?

## 2020-04-05 NOTE — Telephone Encounter (Signed)
    Pt said she was having issue with her heart monitor she's been having skin irritation, [employer] is sending her new monitor for the 3rd time. She said [employer] is not extending her 30 days and if the new monitor comes in and it work she will only be wearing it for a week. She said she doesn't want to pay for 30 days heart monitor with a 1 week worth of data. She would like to speak with Dr. Katrinka Blazing for his recommendation.

## 2020-04-14 ENCOUNTER — Other Ambulatory Visit: Payer: Self-pay

## 2020-04-14 ENCOUNTER — Ambulatory Visit (HOSPITAL_COMMUNITY): Payer: BC Managed Care – PPO | Attending: Internal Medicine

## 2020-04-14 DIAGNOSIS — R011 Cardiac murmur, unspecified: Secondary | ICD-10-CM | POA: Diagnosis not present

## 2020-04-14 LAB — ECHOCARDIOGRAM COMPLETE
AR max vel: 3.12 cm2
AV Mean grad: 3.7 mmHg
AV Peak grad: 5.8 mmHg
Ao pk vel: 1.2 m/s
Area-P 1/2: 3.99 cm2
S' Lateral: 3.1 cm

## 2020-04-14 MED ORDER — PERFLUTREN LIPID MICROSPHERE
1.0000 mL | INTRAVENOUS | Status: AC | PRN
  Administered 2020-04-14: 1 mL via INTRAVENOUS

## 2020-04-26 ENCOUNTER — Encounter: Payer: Self-pay | Admitting: Interventional Cardiology

## 2020-04-26 NOTE — Telephone Encounter (Addendum)
Returned call to patient. Patient had many issues with her monitor (skin irritation/ 2nd monitor broken). I pulled up her report and we had 1 days worth of data. Per the patient she wore it every other day and had pushed the button multiple times to record events. We have no documentation of these events. After discussion with the patient and Mitch the monitor rep we are guessing do to her [place] in an [place] she had no cell service. She would take her monitor off at home and data never transmitted to the company. [employer] has been contacted and she will not be billed for her monitor.   Patient would like to know what Dr Katrinka Blazing thinks she should do now?  I explained unfortunately with her skin irritation a Zio monitor would not be appropriate. I did mention maybe she could buy a Kardia/Alivecor monitor.

## 2020-04-26 NOTE — Telephone Encounter (Signed)
Patient states the monitor was turned off Sunday. She states they were supposed to give her an extended trial and turni it back on that same day. She states she got a call to return it, but they never turned it back on and are saying our office has to call to turn it back on.

## 2020-04-26 NOTE — Telephone Encounter (Signed)
error 

## 2020-05-09 NOTE — Telephone Encounter (Signed)
Lyn Records, MD  Vallarie Mare, Lise Auer, RN A Cardia device with strips recorded when symptomatic seems reasonable. Not willing to commit her to Loop at this time.    Attempted to contact pt.  VM not set up.  Unable to leave message.

## 2020-05-11 DIAGNOSIS — Z01419 Encounter for gynecological examination (general) (routine) without abnormal findings: Secondary | ICD-10-CM | POA: Diagnosis not present

## 2020-05-11 DIAGNOSIS — Z6824 Body mass index (BMI) 24.0-24.9, adult: Secondary | ICD-10-CM | POA: Diagnosis not present

## 2020-05-16 NOTE — Telephone Encounter (Signed)
Spoke with pt and made her aware of information from Dr. Katrinka Blazing.  Pt will obtain a [facility] device.  Advised I will send information over for getting signed up for MyChart so she can send over recordings when she is having symptoms.  Pt appreciative for call.

## 2020-06-06 DIAGNOSIS — Z3141 Encounter for fertility testing: Secondary | ICD-10-CM | POA: Diagnosis not present

## 2020-06-17 DIAGNOSIS — Z3141 Encounter for fertility testing: Secondary | ICD-10-CM | POA: Diagnosis not present

## 2020-07-09 NOTE — L&D Delivery Note (Signed)
Delivery Note At 11:43 AM a viable female was delivered via Vaginal, Spontaneous (Presentation: Left Occiput Anterior).  APGAR: 8, 9; weight 7 lb 9.3 oz (3439 g).   Placenta status: Spontaneous, Intact.  Cord: 3 vessels with the following complications: None.  Cord pH: not sent  Anesthesia: Epidural Episiotomy: None Lacerations: 1st degree;Perineal Suture Repair: 2.0 3.0 vicryl Est. Blood Loss (mL): 282   It's a boy - "[place]"!!  Mom to postpartum.  Baby to Couplet care / Skin to Skin.  Ranae Pila 03/17/2021, 1:44 PM

## 2020-08-22 DIAGNOSIS — Z0000 Encounter for general adult medical examination without abnormal findings: Secondary | ICD-10-CM | POA: Diagnosis not present

## 2020-08-23 DIAGNOSIS — N911 Secondary amenorrhea: Secondary | ICD-10-CM | POA: Diagnosis not present

## 2020-08-24 DIAGNOSIS — Z31430 Encounter of female for testing for genetic disease carrier status for procreative management: Secondary | ICD-10-CM | POA: Diagnosis not present

## 2020-08-30 DIAGNOSIS — Z113 Encounter for screening for infections with a predominantly sexual mode of transmission: Secondary | ICD-10-CM | POA: Diagnosis not present

## 2020-08-30 DIAGNOSIS — Z3401 Encounter for supervision of normal first pregnancy, first trimester: Secondary | ICD-10-CM | POA: Diagnosis not present

## 2020-08-30 DIAGNOSIS — Z3A10 10 weeks gestation of pregnancy: Secondary | ICD-10-CM | POA: Diagnosis not present

## 2020-09-12 DIAGNOSIS — Z3682 Encounter for antenatal screening for nuchal translucency: Secondary | ICD-10-CM | POA: Diagnosis not present

## 2020-09-12 DIAGNOSIS — Z3481 Encounter for supervision of other normal pregnancy, first trimester: Secondary | ICD-10-CM | POA: Diagnosis not present

## 2020-09-12 DIAGNOSIS — Z3A12 12 weeks gestation of pregnancy: Secondary | ICD-10-CM | POA: Diagnosis not present

## 2020-11-11 DIAGNOSIS — Z363 Encounter for antenatal screening for malformations: Secondary | ICD-10-CM | POA: Diagnosis not present

## 2020-11-11 DIAGNOSIS — Z3A20 20 weeks gestation of pregnancy: Secondary | ICD-10-CM | POA: Diagnosis not present

## 2020-11-28 DIAGNOSIS — Z3189 Encounter for other procreative management: Secondary | ICD-10-CM | POA: Diagnosis not present

## 2020-12-08 DIAGNOSIS — Z362 Encounter for other antenatal screening follow-up: Secondary | ICD-10-CM | POA: Diagnosis not present

## 2020-12-08 DIAGNOSIS — Z3A24 24 weeks gestation of pregnancy: Secondary | ICD-10-CM | POA: Diagnosis not present

## 2021-01-11 DIAGNOSIS — Z3480 Encounter for supervision of other normal pregnancy, unspecified trimester: Secondary | ICD-10-CM | POA: Diagnosis not present

## 2021-01-11 DIAGNOSIS — Z23 Encounter for immunization: Secondary | ICD-10-CM | POA: Diagnosis not present

## 2021-02-22 ENCOUNTER — Other Ambulatory Visit: Payer: Self-pay

## 2021-02-22 ENCOUNTER — Inpatient Hospital Stay (HOSPITAL_COMMUNITY)
Admission: AD | Admit: 2021-02-22 | Discharge: 2021-02-22 | Disposition: A | Discharge status: Home / Self Care | Payer: BC Managed Care – PPO | Attending: Obstetrics and Gynecology | Admitting: Obstetrics and Gynecology

## 2021-02-22 ENCOUNTER — Encounter (HOSPITAL_COMMUNITY): Payer: Self-pay

## 2021-02-22 DIAGNOSIS — B373 Candidiasis of vulva and vagina: Secondary | ICD-10-CM | POA: Diagnosis not present

## 2021-02-22 DIAGNOSIS — O368130 Decreased fetal movements, third trimester, not applicable or unspecified: Secondary | ICD-10-CM | POA: Diagnosis not present

## 2021-02-22 DIAGNOSIS — Z0371 Encounter for suspected problem with amniotic cavity and membrane ruled out: Secondary | ICD-10-CM | POA: Diagnosis not present

## 2021-02-22 DIAGNOSIS — Z3A35 35 weeks gestation of pregnancy: Secondary | ICD-10-CM | POA: Insufficient documentation

## 2021-02-22 DIAGNOSIS — O98813 Other maternal infectious and parasitic diseases complicating pregnancy, third trimester: Secondary | ICD-10-CM | POA: Insufficient documentation

## 2021-02-22 DIAGNOSIS — B3731 Acute candidiasis of vulva and vagina: Secondary | ICD-10-CM

## 2021-02-22 LAB — URINALYSIS, ROUTINE W REFLEX MICROSCOPIC
Bilirubin Urine: NEGATIVE
Glucose, UA: NEGATIVE mg/dL
Hgb urine dipstick: NEGATIVE
Ketones, ur: NEGATIVE mg/dL
Nitrite: NEGATIVE
Protein, ur: NEGATIVE mg/dL
Specific Gravity, Urine: 1.006 (ref 1.005–1.030)
pH: 7 (ref 5.0–8.0)

## 2021-02-22 LAB — AMNISURE RUPTURE OF MEMBRANE (ROM) NOT AT ARMC: Amnisure ROM: NEGATIVE

## 2021-02-22 LAB — WET PREP, GENITAL
Clue Cells Wet Prep HPF POC: NONE SEEN
Sperm: NONE SEEN
Trich, Wet Prep: NONE SEEN

## 2021-02-22 MED ORDER — FLUCONAZOLE 150 MG PO TABS: 150.0000 mg | ORAL_TABLET | ORAL | 0 refills | Status: DC

## 2021-02-22 NOTE — MAU Provider Note (Signed)
History     CSN: [id number]  Arrival date and time: 02/22/21 1308   Event Date/Time   First Provider Initiated Contact with Patient 02/22/21 1350      Chief Complaint  Patient presents with   Decreased Fetal Movement   Rupture of Membranes   Angela Rogers is a 25 y.o. year old G1P0 female at [redacted]w[redacted]d weeks gestation who presents to MAU reporting DFM since this morning at 0900 and leaking fluid since 02/07/2021. She reports she was on a beach trip and had fluid leaking down her legs from her vaginal area after waking up. She had not been in the shower or swimming prior to the leaking. She states the RN line advised her it was normal and to come to MAU for UC's or DFM. She reports her underwear continue to be wet daily. She also reports the d/c is clear, but has not become a "weird green" color and her BH ctx's are increasing everyday. She had an appointment at her [facility] on 02/13/2021. She told them about the leaking, but they did not check it out. Her spouse is present and contributing to the history taking.    OB History     Gravida  1   Para      Term      Preterm      AB      Living         SAB      IAB      Ectopic      Multiple      Live Births              Past Medical History:  Diagnosis Date   Abdominal cramping    Chest pain    Diarrhea    Heart murmur    Palpitations    SOB (shortness of breath)     Past Surgical History:  Procedure Laterality Date   CHOLECYSTECTOMY      History reviewed. No pertinent family history.  Social History   Tobacco Use   Smoking status: Never   Smokeless tobacco: Never  Vaping Use   Vaping Use: Never used  Substance Use Topics   Alcohol use: Never   Drug use: Never    Allergies:  Allergies  Allergen Reactions   Penicillins Rash   Chlorhexidine Hives   Povidone Iodine Hives    Pt stated possible allergy to shellfish     Medications Prior to Admission  Medication Sig Dispense  Refill Last Dose   cetirizine (ZYRTEC) 10 MG tablet Take 10 mg by mouth daily as needed for allergies.      escitalopram (LEXAPRO) 5 MG tablet Take 5 mg by mouth at bedtime.       Review of Systems  Constitutional: Negative.   HENT: Negative.    Eyes: Negative.   Respiratory: Negative.    Cardiovascular: Negative.   Gastrointestinal: Negative.   Endocrine: Negative.   Genitourinary:  Positive for pelvic pain (cramping) and vaginal discharge (since 02/07/21, was clear, but now a "weird" green color).  Musculoskeletal: Negative.   Skin: Negative.   Allergic/Immunologic: Negative.   Neurological: Negative.   Hematological: Negative.   Psychiatric/Behavioral: Negative.    Physical Exam   Blood pressure 116/64, pulse 70, temperature 97.9 F (36.6 C), resp. rate 12, last menstrual period 03/02/2020, SpO2 99 %.  Physical Exam Vitals and nursing note reviewed. Exam conducted with a chaperone present.  Constitutional:  Appearance: Normal appearance. She is normal weight.  Cardiovascular:     Rate and Rhythm: Normal rate.     Pulses: Normal pulses.  Pulmonary:     Effort: Pulmonary effort is normal.  Abdominal:     Palpations: Abdomen is soft.  Genitourinary:    Comments: Pelvic exam: External genitalia- moderately swollen vulva, SE: vaginal walls pink and well rugated, cervix is smooth, pink, no lesions, copious amt of thick, curdy, greenish-yellow, vaginal d/c -- WP and Amnisure done, cervix unable to visualize, Dilation: 1  Effacement (%): 70  Station: -3  vertex presentation. Uterus is non-tender, S=D, no CMT or friability, no adnexal tenderness.  Musculoskeletal:        General: Normal range of motion.  Skin:    General: Skin is warm and dry.  Neurological:     Mental Status: She is alert and oriented to person, place, and time.  Psychiatric:        Mood and Affect: Mood normal.        Behavior: Behavior normal.        Thought Content: Thought content normal.         Judgment: Judgment normal.   REACTIVE NST - FHR: 130 bpm / moderate variability / accels present / decels absent / TOCO: regular every 3-4 mins  MAU Course  Procedures  MDM CCUA Amnisure Wet Prep  Results for orders placed or performed during the hospital encounter of 02/22/21 (from the past 24 hour(s))  Urinalysis, Routine w reflex microscopic Urine, Clean Catch     Status: Abnormal   Collection Time: 02/22/21  1:41 PM  Result Value Ref Range   Color, Urine STRAW (A) YELLOW   APPearance CLEAR CLEAR   Specific Gravity, Urine 1.006 1.005 - 1.030   pH 7.0 5.0 - 8.0   Glucose, UA NEGATIVE NEGATIVE mg/dL   Hgb urine dipstick NEGATIVE NEGATIVE   Bilirubin Urine NEGATIVE NEGATIVE   Ketones, ur NEGATIVE NEGATIVE mg/dL   Protein, ur NEGATIVE NEGATIVE mg/dL   Nitrite NEGATIVE NEGATIVE   Leukocytes,Ua LARGE (A) NEGATIVE   RBC / HPF 6-10 0 - 5 RBC/hpf   WBC, UA 0-5 0 - 5 WBC/hpf   Bacteria, UA RARE (A) NONE SEEN   Squamous Epithelial / LPF 0-5 0 - 5  Amnisure rupture of membrane (rom)not at [facility]     Status: None   Collection Time: 02/22/21  2:33 PM  Result Value Ref Range   Amnisure ROM NEGATIVE   Wet prep, genital     Status: Abnormal   Collection Time: 02/22/21  2:39 PM  Result Value Ref Range   Yeast Wet Prep HPF POC PRESENT (A) NONE SEEN   Trich, Wet Prep NONE SEEN NONE SEEN   Clue Cells Wet Prep HPF POC NONE SEEN NONE SEEN   WBC, Wet Prep HPF POC MANY (A) NONE SEEN   Sperm NONE SEEN    Assessment and Plan  1. Candidal vaginitis - Information provided on vaginal yeast infection  - Rx for Fluconazole 150 mg tablet po every 3 days x 3 doses  2. [redacted] weeks gestation of pregnancy  3. No leakage of amniotic fluid into vagina - Reassurance given that membranes are intact - Explained that there can be watery component to vaginal yeast infection that will mimic leaking fluid - Advised to call OB office for any further leaking of fluid  - Discharge patient - Keep scheduled appt  with [facility] - Patient verbalized an understanding of the plan of  care and agrees.   Raelyn Mora, CNM 02/22/2021, 1:50 PM

## 2021-02-22 NOTE — Discharge Instructions (Signed)
After your exam today it has been determined that your water did NOT break. Please call your OB's office, if you begin to leak fluid again.

## 2021-02-22 NOTE — MAU Note (Signed)
Pt reports she last felt the baby move this morning at 0900.   Pt reports she started leaking fluid on August 2 and the fluid was leaking down her legs. She called the nurse call line and was told that it could be normal to only come in if she had decreased fetal movement or contractions

## 2021-02-28 DIAGNOSIS — Z3685 Encounter for antenatal screening for Streptococcus B: Secondary | ICD-10-CM | POA: Diagnosis not present

## 2021-03-09 DIAGNOSIS — Z113 Encounter for screening for infections with a predominantly sexual mode of transmission: Secondary | ICD-10-CM | POA: Diagnosis not present

## 2021-03-14 ENCOUNTER — Other Ambulatory Visit: Payer: Self-pay

## 2021-03-14 ENCOUNTER — Encounter (HOSPITAL_COMMUNITY): Payer: Self-pay | Admitting: Obstetrics and Gynecology

## 2021-03-14 ENCOUNTER — Inpatient Hospital Stay (EMERGENCY_DEPARTMENT_HOSPITAL)
Admission: AD | Admit: 2021-03-14 | Discharge: 2021-03-15 | Disposition: A | Discharge status: Home / Self Care | Payer: BC Managed Care – PPO | Source: Home / Self Care | Attending: Obstetrics and Gynecology | Admitting: Obstetrics and Gynecology

## 2021-03-14 DIAGNOSIS — Z880 Allergy status to penicillin: Secondary | ICD-10-CM | POA: Insufficient documentation

## 2021-03-14 DIAGNOSIS — O26893 Other specified pregnancy related conditions, third trimester: Secondary | ICD-10-CM | POA: Insufficient documentation

## 2021-03-14 DIAGNOSIS — Z3A38 38 weeks gestation of pregnancy: Secondary | ICD-10-CM

## 2021-03-14 DIAGNOSIS — N898 Other specified noninflammatory disorders of vagina: Secondary | ICD-10-CM | POA: Insufficient documentation

## 2021-03-14 LAB — POCT FERN TEST: POCT Fern Test: NEGATIVE

## 2021-03-14 NOTE — MAU Note (Signed)
Pt reports she has a wet pad at 1400. Pt reports she felt some trickling later in the day. Pt reports getting out of the shower around 2100. Pt reports drying in between her legs and then noticing a gush of fluid down her legs.   Denies vaginal bleeding .   Reports +FM

## 2021-03-14 NOTE — MAU Provider Note (Signed)
Chief Complaint:  Vaginal Discharge and Back Pain   Event Date/Time   First Provider Initiated Contact with Patient 03/14/21 2324     HPI: Angela Rogers is a 25 y.o. G1P0 at 18w1dwho presents to maternity admissions reporting leaking fluid after getting out of shower.  States it was clear and did not smell like urine.. She reports good fetal movement, denies vaginal bleeding, vaginal itching/burning, urinary symptoms, h/a, dizziness, n/v, diarrhea, constipation or fever/chills.  Vaginal Discharge The patient's primary symptoms include vaginal discharge. The patient's pertinent negatives include no genital itching, genital lesions, genital odor or vaginal bleeding. This is a new problem. The current episode started today. She is pregnant. Pertinent negatives include no abdominal pain, chills, dysuria, fever or flank pain.    RN Note: Pt reports she has a wet pad at 1400. Pt reports she felt some trickling later in the day. Pt reports getting out of the shower around 2100. Pt reports drying in between her legs and then noticing a gush of fluid down her legs.   Denies vaginal bleeding . Reports +FM  Past Medical History: Past Medical History:  Diagnosis Date   Abdominal cramping    Chest pain    Diarrhea    Heart murmur    Palpitations    SOB (shortness of breath)     Past obstetric history: OB History  Gravida Para Term Preterm AB Living  1            SAB IAB Ectopic Multiple Live Births               # Outcome Date GA Lbr Len/2nd Weight Sex Delivery Anes PTL Lv  1 Current             Past Surgical History: Past Surgical History:  Procedure Laterality Date   CHOLECYSTECTOMY      Family History: History reviewed. No pertinent family history.  Social History: Social History   Tobacco Use   Smoking status: Never   Smokeless tobacco: Never  Vaping Use   Vaping Use: Never used  Substance Use Topics   Alcohol use: Never   Drug use: Never    Allergies:   Allergies  Allergen Reactions   Penicillins Rash   Chlorhexidine Hives   Povidone Iodine Hives    Pt stated possible allergy to shellfish     Meds:  Medications Prior to Admission  Medication Sig Dispense Refill Last Dose   cetirizine (ZYRTEC) 10 MG tablet Take 10 mg by mouth daily as needed for allergies.      escitalopram (LEXAPRO) 5 MG tablet Take 5 mg by mouth at bedtime.      fluconazole (DIFLUCAN) 150 MG tablet Take 1 tablet (150 mg total) by mouth every 3 (three) days. 3 tablet 0     I have reviewed patient's Past Medical Hx, Surgical Hx, Family Hx, Social Hx, medications and allergies.   ROS:  Review of Systems  Constitutional:  Negative for chills and fever.  Gastrointestinal:  Negative for abdominal pain.  Genitourinary:  Positive for vaginal discharge. Negative for dysuria and flank pain.  Other systems negative  Physical Exam  Patient Vitals for the past 24 hrs:  BP Temp Temp src Pulse Resp SpO2 Weight  03/14/21 2245 122/72 98.1 F (36.7 C) Oral 79 18 99 % --  03/14/21 2236 -- -- -- -- -- -- 80.9 kg   Constitutional: Well-developed, well-nourished female in no acute distress.  Cardiovascular: normal rate and rhythm Respiratory: normal  effort, clear to auscultation bilaterally GI: Abd soft, non-tender, gravid appropriate for gestational age.   No rebound or guarding. MS: Extremities nontender, no edema, normal ROM Neurologic: Alert and oriented x 4.  GU: Neg CVAT.  PELVIC EXAM: Cervix pink, visually closed, without lesion, moderate white creamy discharge, vaginal walls and external genitalia normal     NO POOLING, NO FERNING   Dilation: 2.5 Exam by:: Wynelle Bourgeois, CNM Cervix 2+/80/-1/vertex/bulging membranes  FHT:  Baseline 140 , moderate variability, accelerations present, no decelerations Contractions:  Irregular     Labs: Results for orders placed or performed during the hospital encounter of 03/14/21 (from the past 24 hour(s))  Fern Test      Status: Normal   Collection Time: 03/14/21 10:51 PM  Result Value Ref Range   POCT Fern Test Negative = intact amniotic membranes       Imaging:  No results found.  MAU Course/MDM: Exam was negative for ruptured membranes.  NST reviewed, reassuring.  Treatments in MAU included EFM, SSE.    Assessment: Single IUP at [redacted]w[redacted]d Vaginal discharge  No evidence of ROM  Plan: Discharge home Labor precautions and fetal kick counts Follow up in Office for prenatal visit, has appt today Encouraged to return if she develops worsening of symptoms, increase in pain, fever, or other concerning symptoms.  Pt stable at time of discharge.  Wynelle Bourgeois CNM, MSN Certified Nurse-Midwife 03/14/2021 11:24 PM

## 2021-03-15 DIAGNOSIS — O4290 Premature rupture of membranes, unspecified as to length of time between rupture and onset of labor, unspecified weeks of gestation: Secondary | ICD-10-CM | POA: Diagnosis not present

## 2021-03-15 DIAGNOSIS — O26893 Other specified pregnancy related conditions, third trimester: Secondary | ICD-10-CM | POA: Diagnosis not present

## 2021-03-15 DIAGNOSIS — Z3A38 38 weeks gestation of pregnancy: Secondary | ICD-10-CM | POA: Diagnosis not present

## 2021-03-15 DIAGNOSIS — Z20822 Contact with and (suspected) exposure to covid-19: Secondary | ICD-10-CM | POA: Diagnosis not present

## 2021-03-15 DIAGNOSIS — Z880 Allergy status to penicillin: Secondary | ICD-10-CM | POA: Diagnosis not present

## 2021-03-15 DIAGNOSIS — N898 Other specified noninflammatory disorders of vagina: Secondary | ICD-10-CM | POA: Diagnosis not present

## 2021-03-17 ENCOUNTER — Inpatient Hospital Stay (HOSPITAL_COMMUNITY)
Admission: AD | Admit: 2021-03-17 | Discharge: 2021-03-18 | DRG: 807 | Disposition: A | Discharge status: Home / Self Care | Payer: BC Managed Care – PPO | Attending: Obstetrics and Gynecology | Admitting: Obstetrics and Gynecology

## 2021-03-17 ENCOUNTER — Inpatient Hospital Stay (HOSPITAL_COMMUNITY): Payer: BC Managed Care – PPO | Admitting: Anesthesiology

## 2021-03-17 ENCOUNTER — Encounter (HOSPITAL_COMMUNITY): Payer: Self-pay | Admitting: Obstetrics and Gynecology

## 2021-03-17 ENCOUNTER — Other Ambulatory Visit: Payer: Self-pay

## 2021-03-17 DIAGNOSIS — Z3A38 38 weeks gestation of pregnancy: Secondary | ICD-10-CM | POA: Diagnosis not present

## 2021-03-17 DIAGNOSIS — Z880 Allergy status to penicillin: Secondary | ICD-10-CM | POA: Diagnosis not present

## 2021-03-17 DIAGNOSIS — O26893 Other specified pregnancy related conditions, third trimester: Secondary | ICD-10-CM | POA: Diagnosis present

## 2021-03-17 DIAGNOSIS — Z20822 Contact with and (suspected) exposure to covid-19: Secondary | ICD-10-CM | POA: Diagnosis present

## 2021-03-17 DIAGNOSIS — N898 Other specified noninflammatory disorders of vagina: Secondary | ICD-10-CM | POA: Diagnosis present

## 2021-03-17 DIAGNOSIS — Z3490 Encounter for supervision of normal pregnancy, unspecified, unspecified trimester: Secondary | ICD-10-CM

## 2021-03-17 LAB — CBC
HCT: 37.6 % (ref 36.0–46.0)
Hemoglobin: 12.5 g/dL (ref 12.0–15.0)
MCH: 30.9 pg (ref 26.0–34.0)
MCHC: 33.2 g/dL (ref 30.0–36.0)
MCV: 93.1 fL (ref 80.0–100.0)
Platelets: 213 10*3/uL (ref 150–400)
RBC: 4.04 MIL/uL (ref 3.87–5.11)
RDW: 13.5 % (ref 11.5–15.5)
WBC: 16 10*3/uL — ABNORMAL HIGH (ref 4.0–10.5)
nRBC: 0 % (ref 0.0–0.2)

## 2021-03-17 LAB — RESP PANEL BY RT-PCR (FLU A&B, COVID) ARPGX2
Influenza A by PCR: NEGATIVE
Influenza B by PCR: NEGATIVE
SARS Coronavirus 2 by RT PCR: NEGATIVE

## 2021-03-17 LAB — RPR: RPR Ser Ql: NONREACTIVE

## 2021-03-17 LAB — TYPE AND SCREEN
ABO/RH(D): A POS
Antibody Screen: NEGATIVE

## 2021-03-17 MED ORDER — SIMETHICONE 80 MG PO CHEW: 80.0000 mg | CHEWABLE_TABLET | ORAL | Status: DC | PRN

## 2021-03-17 MED ORDER — TETANUS-DIPHTH-ACELL PERTUSSIS 5-2.5-18.5 LF-MCG/0.5 IM SUSY: 0.5000 mL | PREFILLED_SYRINGE | Freq: Once | INTRAMUSCULAR | Status: DC

## 2021-03-17 MED ORDER — BENZOCAINE-MENTHOL 20-0.5 % EX AERO
1.0000 "application " | INHALATION_SPRAY | CUTANEOUS | Status: DC | PRN
  Administered 2021-03-17: 1 via TOPICAL
  Filled 2021-03-17: qty 56

## 2021-03-17 MED ORDER — EPHEDRINE 5 MG/ML INJ: 10.0000 mg | INTRAVENOUS | Status: DC | PRN

## 2021-03-17 MED ORDER — IBUPROFEN 600 MG PO TABS
600.0000 mg | ORAL_TABLET | Freq: Four times a day (QID) | ORAL | Status: DC
  Administered 2021-03-17 – 2021-03-18 (×5): 600 mg via ORAL
  Filled 2021-03-17 (×5): qty 1

## 2021-03-17 MED ORDER — PHENYLEPHRINE 40 MCG/ML (10ML) SYRINGE FOR IV PUSH (FOR BLOOD PRESSURE SUPPORT): 80.0000 ug | PREFILLED_SYRINGE | INTRAVENOUS | Status: DC | PRN

## 2021-03-17 MED ORDER — DIPHENHYDRAMINE HCL 25 MG PO CAPS: 25.0000 mg | ORAL_CAPSULE | Freq: Four times a day (QID) | ORAL | Status: DC | PRN

## 2021-03-17 MED ORDER — OXYCODONE HCL 5 MG PO TABS: 10.0000 mg | ORAL_TABLET | ORAL | Status: DC | PRN

## 2021-03-17 MED ORDER — WITCH HAZEL-GLYCERIN EX PADS: 1.0000 "application " | MEDICATED_PAD | CUTANEOUS | Status: DC | PRN

## 2021-03-17 MED ORDER — LACTATED RINGERS IV SOLN: 500.0000 mL | Freq: Once | INTRAVENOUS | Status: DC

## 2021-03-17 MED ORDER — HYDROXYZINE HCL 50 MG PO TABS: 50.0000 mg | ORAL_TABLET | Freq: Four times a day (QID) | ORAL | Status: DC | PRN

## 2021-03-17 MED ORDER — FENTANYL-BUPIVACAINE-NACL 0.5-0.125-0.9 MG/250ML-% EP SOLN
EPIDURAL | Status: AC
  Filled 2021-03-17: qty 250

## 2021-03-17 MED ORDER — ACETAMINOPHEN 325 MG PO TABS: 650.0000 mg | ORAL_TABLET | ORAL | Status: DC | PRN

## 2021-03-17 MED ORDER — COCONUT OIL OIL: 1.0000 "application " | TOPICAL_OIL | Status: DC | PRN

## 2021-03-17 MED ORDER — DIBUCAINE (PERIANAL) 1 % EX OINT: 1.0000 "application " | TOPICAL_OINTMENT | CUTANEOUS | Status: DC | PRN

## 2021-03-17 MED ORDER — LIDOCAINE HCL (PF) 1 % IJ SOLN: 30.0000 mL | INTRAMUSCULAR | Status: DC | PRN

## 2021-03-17 MED ORDER — BUTORPHANOL TARTRATE 1 MG/ML IJ SOLN: 1.0000 mg | INTRAMUSCULAR | Status: DC | PRN

## 2021-03-17 MED ORDER — LACTATED RINGERS IV SOLN: 500.0000 mL | INTRAVENOUS | Status: DC | PRN

## 2021-03-17 MED ORDER — FENTANYL-BUPIVACAINE-NACL 0.5-0.125-0.9 MG/250ML-% EP SOLN
12.0000 mL/h | EPIDURAL | Status: DC | PRN
Start: 2021-03-17 — End: 2021-03-17
  Administered 2021-03-17: 12 mL/h via EPIDURAL

## 2021-03-17 MED ORDER — OXYTOCIN-SODIUM CHLORIDE 30-0.9 UT/500ML-% IV SOLN
2.5000 [IU]/h | INTRAVENOUS | Status: DC
  Administered 2021-03-17: 2.5 [IU]/h via INTRAVENOUS
  Filled 2021-03-17: qty 500

## 2021-03-17 MED ORDER — PRENATAL MULTIVITAMIN CH
1.0000 | ORAL_TABLET | Freq: Every day | ORAL | Status: DC
  Filled 2021-03-17: qty 1

## 2021-03-17 MED ORDER — OXYCODONE HCL 5 MG PO TABS: 5.0000 mg | ORAL_TABLET | ORAL | Status: DC | PRN

## 2021-03-17 MED ORDER — LIDOCAINE HCL (PF) 1 % IJ SOLN
INTRAMUSCULAR | Status: DC | PRN
  Administered 2021-03-17 (×2): 4 mL via EPIDURAL

## 2021-03-17 MED ORDER — LACTATED RINGERS IV SOLN: INTRAVENOUS | Status: DC

## 2021-03-17 MED ORDER — DIPHENHYDRAMINE HCL 50 MG/ML IJ SOLN
12.5000 mg | INTRAMUSCULAR | Status: DC | PRN
Start: 2021-03-17 — End: 2021-03-17

## 2021-03-17 MED ORDER — OXYTOCIN BOLUS FROM INFUSION
333.0000 mL | Freq: Once | INTRAVENOUS | Status: AC
  Administered 2021-03-17: 333 mL via INTRAVENOUS

## 2021-03-17 MED ORDER — BUPIVACAINE HCL (PF) 0.25 % IJ SOLN
INTRAMUSCULAR | Status: DC | PRN
  Administered 2021-03-17: 8 mL via EPIDURAL

## 2021-03-17 MED ORDER — SOD CITRATE-CITRIC ACID 500-334 MG/5ML PO SOLN: 30.0000 mL | ORAL | Status: DC | PRN

## 2021-03-17 MED ORDER — ONDANSETRON HCL 4 MG PO TABS: 4.0000 mg | ORAL_TABLET | ORAL | Status: DC | PRN

## 2021-03-17 MED ORDER — ONDANSETRON HCL 4 MG/2ML IJ SOLN: 4.0000 mg | INTRAMUSCULAR | Status: DC | PRN

## 2021-03-17 MED ORDER — ONDANSETRON HCL 4 MG/2ML IJ SOLN: 4.0000 mg | Freq: Four times a day (QID) | INTRAMUSCULAR | Status: DC | PRN

## 2021-03-17 MED ORDER — SENNOSIDES-DOCUSATE SODIUM 8.6-50 MG PO TABS
2.0000 | ORAL_TABLET | ORAL | Status: DC
  Administered 2021-03-17 – 2021-03-18 (×2): 2 via ORAL
  Filled 2021-03-17 (×2): qty 2

## 2021-03-17 MED ORDER — ZOLPIDEM TARTRATE 5 MG PO TABS: 5.0000 mg | ORAL_TABLET | Freq: Every evening | ORAL | Status: DC | PRN

## 2021-03-17 NOTE — Progress Notes (Signed)
Late entry s/s patient care.  C/c/+2 and pushing with good effort

## 2021-03-17 NOTE — H&P (Signed)
OB History and Physical   Angela Rogers is a 25 y.o. female G1P0 presenting for contractions at [redacted]w[redacted]d, admitted for labor at 4/90/-2.  Pregnancy has been uncomplicated, GBS negative, Rh positive, Low risk NIPS   OB History     Gravida  1   Para      Term      Preterm      AB      Living         SAB      IAB      Ectopic      Multiple      Live Births             Past Medical History:  Diagnosis Date   Abdominal cramping    Chest pain    Diarrhea    Heart murmur    Palpitations    SOB (shortness of breath)    Past Surgical History:  Procedure Laterality Date   CHOLECYSTECTOMY     Family History: family history is not on file. Social History:  reports that she has never smoked. She has never used smokeless tobacco. She reports that she does not drink alcohol and does not use drugs.     Maternal Diabetes: No Genetic Screening: Normal Maternal Ultrasounds/Referrals: Normal Fetal Ultrasounds or other Referrals:  None Maternal Substance Abuse:  No Significant Maternal Medications:  None Significant Maternal Lab Results:  Group B Strep negative Other Comments:  None  Review of Systems Denies fever, chills, SOB, CP, HA, vision changes History Dilation: 4 Effacement (%): 90 Station: -2 Exam by:: Remigio Eisenmenger, RN Blood pressure 134/74, pulse 79, temperature (!) 97.3 F (36.3 C), resp. rate 16, height 5\' 6"  (1.676 m), weight 81.2 kg, last menstrual period 03/02/2020. Exam Physical Exam  Gen: alert, well appearing, no distress Chest: nonlabored breathing CV: no peripheral edema Abdomen: gravid, ctx present Ext: no evidence of DVT   Prenatal labs: ABO, Rh:   Rh positive Antibody:   neg Rubella:   RI RPR:    NR HBsAg:   neg HIV:   neg GBS:   neg  Assessment/Plan: Admit to Labor and Delivery Augment with pitocin as needed Epidural when desired Anticipate vaginal delivery   03/04/2020 03/17/2021, 4:40 AM

## 2021-03-17 NOTE — MAU Note (Signed)
Ctxs since 1900. Was 3cm last sve. No LOF but some bloody show.

## 2021-03-17 NOTE — Anesthesia Preprocedure Evaluation (Signed)
Anesthesia Evaluation  Patient identified by MRN, date of birth, ID band Patient awake    Reviewed: Allergy & Precautions, Patient's Chart, lab work & pertinent test results  History of Anesthesia Complications Negative for: history of anesthetic complications  Airway Mallampati: II  TM Distance: >3 FB Neck ROM: Full    Dental no notable dental hx.    Pulmonary neg pulmonary ROS,    Pulmonary exam normal        Cardiovascular negative cardio ROS Normal cardiovascular exam     Neuro/Psych negative neurological ROS  negative psych ROS   GI/Hepatic negative GI ROS, Neg liver ROS,   Endo/Other  negative endocrine ROS  Renal/GU negative Renal ROS  negative genitourinary   Musculoskeletal negative musculoskeletal ROS (+)   Abdominal   Peds  Hematology negative hematology ROS (+)   Anesthesia Other Findings Day of surgery medications reviewed with patient.  Reproductive/Obstetrics (+) Pregnancy                             Anesthesia Physical Anesthesia Plan  ASA: 2  Anesthesia Plan: Epidural   Post-op Pain Management:    Induction:   PONV Risk Score and Plan: Treatment may vary due to age or medical condition  Airway Management Planned: Natural Airway  Additional Equipment:   Intra-op Plan:   Post-operative Plan:   Informed Consent: I have reviewed the patients History and Physical, chart, labs and discussed the procedure including the risks, benefits and alternatives for the proposed anesthesia with the patient or authorized representative who has indicated his/her understanding and acceptance.       Plan Discussed with:   Anesthesia Plan Comments:         Anesthesia Quick Evaluation  

## 2021-03-17 NOTE — Lactation Note (Signed)
This note was copied from a baby's chart. Lactation Consultation Note  Patient Name: Boy Nimco Bivens Today's Date: 03/17/2021 Reason for consult: L&D Initial assessment;Early term 37-38.6wks;Primapara;1st time breastfeeding Age:25 hours   Initial L&D Consult:  Returned per mother's request. Visited >1 hour after birth Baby awake; attempted to latch, however, he was not interested in opening his mouth to initiate a suck. Taught mother hand expression and finger fed drops to baby.  He was still not interested in initiating a suck on my gloved finger; rubbed drops into his cheeks and on his tongue.  Reassurance provided.  Informed parents that lactation services will be available on the M/B unit.  Allowed time for family bonding. Father and one family member present.   Maternal Data Has patient been taught Hand Expression?: Yes Does the patient have breastfeeding experience prior to this delivery?: No  Feeding Mother's Current Feeding Choice: Breast Milk  LATCH Score Latch: Too sleepy or reluctant, no latch achieved, no sucking elicited.  Audible Swallowing: None  Type of Nipple: Everted at rest and after stimulation  Comfort (Breast/Nipple): Soft / non-tender  Hold (Positioning): Assistance needed to correctly position infant at breast and maintain latch.  LATCH Score: 5   Lactation Tools Discussed/Used    Interventions Interventions: Breast feeding basics reviewed;Skin to skin  Discharge    Consult Status Consult Status: Follow-up from L&D    Nalah Macioce R Ellisha Bankson 03/17/2021, 12:50 PM

## 2021-03-17 NOTE — Anesthesia Postprocedure Evaluation (Signed)
Anesthesia Post Note  Patient: Angela Rogers  Procedure(s) Performed: AN AD HOC LABOR EPIDURAL     Patient location during evaluation: Mother Baby Anesthesia Type: Epidural Level of consciousness: awake and alert Pain management: pain level controlled Vital Signs Assessment: post-procedure vital signs reviewed and stable Respiratory status: spontaneous breathing, nonlabored ventilation and respiratory function stable Cardiovascular status: stable Postop Assessment: no headache, no backache and epidural receding Anesthetic complications: no   No notable events documented.  Last Vitals:  Vitals:   03/17/21 1246 03/17/21 1350  BP: 118/65 (!) 124/58  Pulse: 86 76  Resp:  16  Temp:  36.9 C  SpO2:  100%    Last Pain:  Vitals:   03/17/21 1350  TempSrc: Oral  PainSc: 0-No pain   Pain Goal: Patients Stated Pain Goal: 0 (03/17/21 0326)                 Magen Suriano

## 2021-03-17 NOTE — Anesthesia Procedure Notes (Signed)
Epidural Patient location during procedure: OB Start time: 03/17/2021 5:31 AM End time: 03/17/2021 5:35 AM  Staffing Anesthesiologist: Kaylyn Layer, MD Performed: anesthesiologist   Preanesthetic Checklist Completed: patient identified, IV checked, risks and benefits discussed, monitors and equipment checked, pre-op evaluation and timeout performed  Epidural Patient position: sitting Prep: ChloraPrep and site prepped and draped Patient monitoring: continuous pulse ox, blood pressure and heart rate Approach: midline Location: L3-L4 Injection technique: LOR air  Needle:  Needle type: Tuohy  Needle gauge: 17 G Needle length: 9 cm Catheter type: closed end flexible Catheter size: 19 Gauge Catheter at skin depth: 8 cm Test dose: negative and Other (1% lidocaine)  Assessment Events: blood not aspirated, injection not painful, no injection resistance, no paresthesia and negative IV test  Additional Notes Patient identified. Risks, benefits, and alternatives discussed with patient including but not limited to bleeding, infection, nerve damage, paralysis, failed block, incomplete pain control, headache, blood pressure changes, nausea, vomiting, reactions to medication, itching, and postpartum back pain. Confirmed with bedside nurse the patient's most recent platelet count. Confirmed with patient that they are not currently taking any anticoagulation, have any bleeding history, or any family history of bleeding disorders. Patient expressed understanding and wished to proceed. All questions were answered. Sterile technique was used throughout the entire procedure. Please see nursing notes for vital signs.   Allergies to chlorhexidine and iodine discussed-has had contact reaction to both but prefers chlorhexidine if one needs to be used. Area prepped with chloraprep. Crisp LOR on first pass. Test dose was given through epidural catheter and negative prior to continuing to dose epidural or  start infusion. Warning signs of high block given to the patient including shortness of breath, tingling/numbness in hands, complete motor block, or any concerning symptoms with instructions to call for help. Patient was given instructions on fall risk and not to get out of bed. All questions and concerns addressed with instructions to call with any issues or inadequate analgesia.  Reason for block:procedure for pain

## 2021-03-17 NOTE — Lactation Note (Signed)
Lactation Consultation Note  Patient Name: Angela Rogers Date: 03/17/2021 Reason for consult: L&D Initial assessment;1st time breastfeeding;Early term 37-38.6wks;Primapara Age:25 y.o.   Initial L&D Consult:  Attempted to visit with family < 1 hour after birth. Mother on phone and "face timing" grandparents.  Mother requested that lactation consultant return.  Informed RN that I will visit another family and return after that visit.  Mother in agreement.    Maternal Data    Feeding Mother's Current Feeding Choice: Breast Milk  LATCH Score                    Lactation Tools Discussed/Used    Interventions    Discharge    Consult Status Consult Status: Follow-up from L&D    Laurana Magistro R Kevin Space 03/17/2021, 12:26 PM

## 2021-03-17 NOTE — Lactation Note (Signed)
This note was copied from a baby's chart. Lactation Consultation Note  Patient Name: Angela Rogers Today's Date: 03/17/2021 Reason for consult: Initial assessment;1st time breastfeeding;Primapara;Early term 37-38.6wks Age:25 hours   P1 mother whose infant is now 18 hours old.  This is an ETI at 38+4 weeks.  Baby was STS in father's arms when I arrived.  Reviewed breast feeding basics with family.  Mother was pleased to announce that her son recently breast fed for 30 minutes.    Mother will feed 8-12 times/24 hours or sooner if baby shows feeding cues.  Suggested she call her RN/LC for latch assistance as needed.  Mother able to hand express and will finger feed or spoon feed colostrum drops to baby.  Mom made aware of O/P services, breastfeeding support groups, community resources, and our phone # for post-discharge questions.  Mother has a DEBP for home use.  Father and mother's sister present.   Maternal Data Has patient been taught Hand Expression?: Yes Does the patient have breastfeeding experience prior to this delivery?: No  Feeding Mother's Current Feeding Choice: Breast Milk  LATCH Score Latch: Repeated attempts needed to sustain latch, nipple held in mouth throughout feeding, stimulation needed to elicit sucking reflex.  Audible Swallowing: A few with stimulation  Type of Nipple: Everted at rest and after stimulation  Comfort (Breast/Nipple): Soft / non-tender  Hold (Positioning): Assistance needed to correctly position infant at breast and maintain latch.  LATCH Score: 7   Lactation Tools Discussed/Used    Interventions Interventions: Breast feeding basics reviewed;Education  Discharge Pump: Personal WIC Program: No  Consult Status Consult Status: Follow-up Date: 03/18/21 Follow-up type: In-patient    Glendal Cassaday R Kullen Tomasetti 03/17/2021, 4:21 PM

## 2021-03-18 LAB — CBC
HCT: 29.6 % — ABNORMAL LOW (ref 36.0–46.0)
Hemoglobin: 9.9 g/dL — ABNORMAL LOW (ref 12.0–15.0)
MCH: 31.4 pg (ref 26.0–34.0)
MCHC: 33.4 g/dL (ref 30.0–36.0)
MCV: 94 fL (ref 80.0–100.0)
Platelets: 171 10*3/uL (ref 150–400)
RBC: 3.15 MIL/uL — ABNORMAL LOW (ref 3.87–5.11)
RDW: 13.8 % (ref 11.5–15.5)
WBC: 13.7 10*3/uL — ABNORMAL HIGH (ref 4.0–10.5)
nRBC: 0 % (ref 0.0–0.2)

## 2021-03-18 NOTE — Lactation Note (Signed)
This note was copied from a baby's chart. Lactation Consultation Note  Patient Name: Angela Rogers [id number] Date: 03/18/2021 Reason for consult: Follow-up assessment;Early term 37-38.6wks;Primapara;1st time breastfeeding Age:25 hours   P1 mother whose infant is now 25 hours old.  This is an ETI at 38+4 weeks.  Baby was swaddled and asleep in the bassinet when I arrived; just returned from a circumcision.  Mother reported a good night of breast feeding.  Baby is latching and feeding well; multiple voids/stools.  LATCH score 9.  Reviewed the possibility of sleepiness after circumcision.  Mother will continue to hand express and call for latch assistance as needed.  Mother may be interested in discharge today.  She has a DEBP for home use.  Father present.     Maternal Data Has patient been taught Hand Expression?: Yes Does the patient have breastfeeding experience prior to this delivery?: No  Feeding Mother's Current Feeding Choice: Breast Milk  LATCH Score Latch: Grasps breast easily, tongue down, lips flanged, rhythmical sucking.  Audible Swallowing: A few with stimulation  Type of Nipple: Everted at rest and after stimulation  Comfort (Breast/Nipple): Soft / non-tender  Hold (Positioning): No assistance needed to correctly position infant at breast.  LATCH Score: 9   Lactation Tools Discussed/Used    Interventions Interventions: Breast feeding basics reviewed;Education  Discharge Pump: Personal WIC Program: No  Consult Status Consult Status: Follow-up Date: 03/19/21 Follow-up type: In-patient    Annaelle Kasel R Savannaha Stonerock 03/18/2021, 8:12 AM

## 2021-03-18 NOTE — Discharge Summary (Signed)
Postpartum Discharge Summary     Patient Name: Angela Rogers DOB: [birth year] MRN: [id number]  Date of admission: 03/17/2021 Delivery date:03/17/2021  Delivering provider: Tyson Dense  Date of discharge: 03/18/2021  Admitting diagnosis: Pregnancy [Z34.90] Intrauterine pregnancy: [redacted]w[redacted]d     Secondary diagnosis:  Active Problems:   Pregnancy  Additional problems: None    Discharge diagnosis: Term Pregnancy Delivered                                              Post partum procedures: none Augmentation: N/A Complications: None  Hospital course: Onset of Labor With Vaginal Delivery      25 y.o. yo G1P1001 at [redacted]w[redacted]d was admitted in Latent Labor on 03/17/2021. Patient had an uncomplicated labor course as follows:  Membrane Rupture Time/Date: 5:00 AM ,03/17/2021   Delivery Method:Vaginal, Spontaneous  Episiotomy: None  Lacerations:  1st degree;Perineal  Patient had an uncomplicated postpartum course.  She is ambulating, tolerating a regular diet, passing flatus, and urinating well. Patient is discharged home in stable condition on 03/18/21.  Newborn Data: Birth date:03/17/2021  Birth time:11:43 AM  Gender:Female  Living status:Living  Apgars:8 ,9  Weight:3439 g   Magnesium Sulfate received: No BMZ received: No Rhophylac:N/A MMR:N/A T-DaP:Given prenatally Flu: N/A Transfusion:No  Physical exam  Vitals:   03/17/21 1848 03/17/21 2210 03/17/21 2350 03/18/21 0525  BP: 99/78 101/71 107/71 103/77  Pulse: 87 83 79 81  Resp: $Remo'16 16 16 16  'EyprK$ Temp: 98.7 F (37.1 C) 97.9 F (36.6 C) 97.9 F (36.6 C) 98 F (36.7 C)  TempSrc: Oral Oral Oral Oral  SpO2: 98% 100% 99% 100%  Weight:      Height:       General: alert Lochia: appropriate Uterine Fundus: firm Incision: N/A DVT Evaluation: No evidence of DVT seen on physical exam. Labs: Lab Results  Component Value Date   WBC 13.7 (H) 03/18/2021   HGB 9.9 (L) 03/18/2021   HCT 29.6 (L) 03/18/2021   MCV 94.0 03/18/2021    PLT 171 03/18/2021   CMP Latest Ref Rng & Units 03/06/2020  Glucose 70 - 99 mg/dL 83  BUN 6 - 20 mg/dL 5(L)  Creatinine 0.44 - 1.00 mg/dL 0.79  Sodium 135 - 145 mmol/L 137  Potassium 3.5 - 5.1 mmol/L 4.1  Chloride 98 - 111 mmol/L 104  CO2 22 - 32 mmol/L 23  Calcium 8.9 - 10.3 mg/dL 9.4  Total Protein 6.0 - 8.3 g/dL -  Total Bilirubin 0.3 - 1.2 mg/dL -  Alkaline Phos 50 - 162 U/L -  AST 0 - 37 U/L -  ALT 0 - 35 U/L -   Edinburgh Score: Edinburgh Postnatal Depression Scale Screening Tool 03/17/2021  I have been able to laugh and see the funny side of things. 0  I have looked forward with enjoyment to things. 0  I have blamed myself unnecessarily when things went wrong. 0  I have been anxious or worried for no good reason. 2  I have felt scared or panicky for no good reason. 1  Things have been getting on top of me. 0  I have been so unhappy that I have had difficulty sleeping. 0  I have felt sad or miserable. 0  I have been so unhappy that I have been crying. 0  The thought of harming myself has occurred to  me. 0  Edinburgh Postnatal Depression Scale Total 3      After visit meds:  Allergies as of 03/18/2021       Reactions   Penicillins Rash   Chlorhexidine Hives   Povidone Iodine Hives   Pt stated possible allergy to shellfish         Medication List     TAKE these medications    prenatal multivitamin Tabs tablet Take 1 tablet by mouth daily at 12 noon.         Discharge home in stable condition Infant Feeding: Bottle and Breast Infant Disposition:home with mother Discharge instruction: per After Visit Summary and Postpartum booklet. Activity: Advance as tolerated. Pelvic rest for 6 weeks.  Diet: routine diet Anticipated Birth Control: Unsure Postpartum Appointment:6 weeks Additional Postpartum F/U:  none Future Appointments:No future appointments. Follow up Visit:      03/18/2021 Tyson Dense, MD

## 2021-03-30 ENCOUNTER — Telehealth (HOSPITAL_COMMUNITY): Payer: Self-pay | Admitting: *Deleted

## 2021-03-30 NOTE — Telephone Encounter (Signed)
Left message to return nurse call.  Duffy Rhody, RN 03-30-2021 at 11:37am

## 2021-04-13 IMAGING — US ULTRASOUND ABDOMEN COMPLETE
1 series · 14 of 25 positions shown · non-contrast
Comparison: None.

CLINICAL DATA: Biliary calculus

EXAM:
ABDOMEN ULTRASOUND COMPLETE

[Series 1: ultrasound abdomen complete · 14 of 85 slices shown]
[im 1/85]
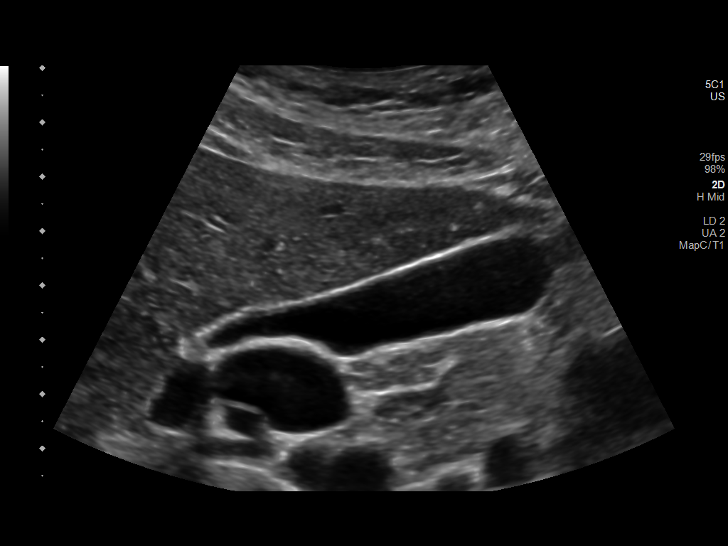
[im 8/85]
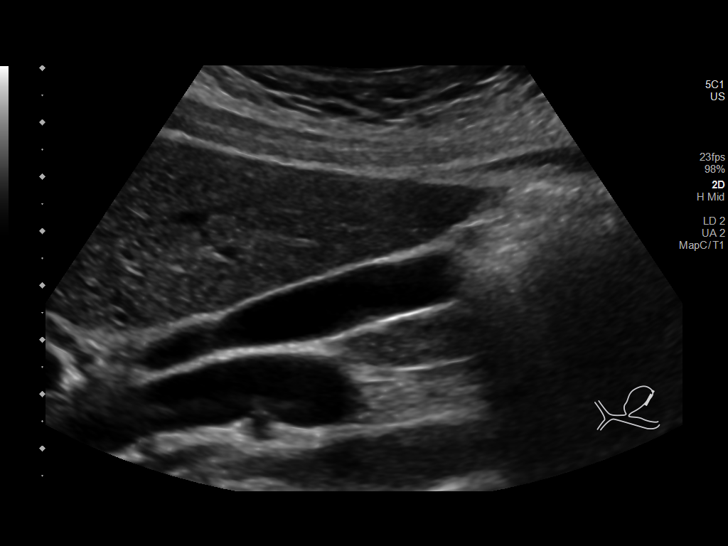
[im 15/85]
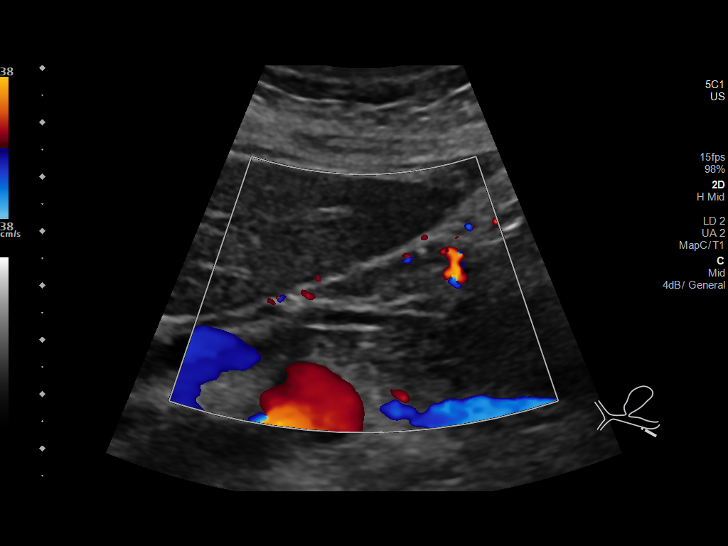
[im 22/85]
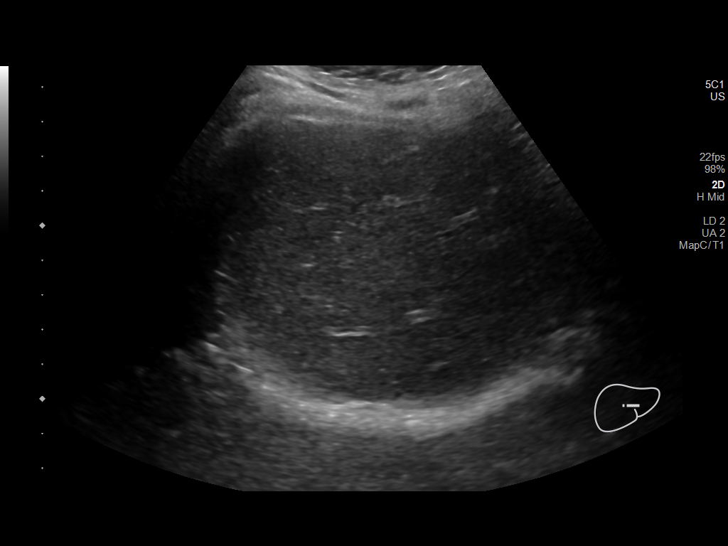
[im 29/85]
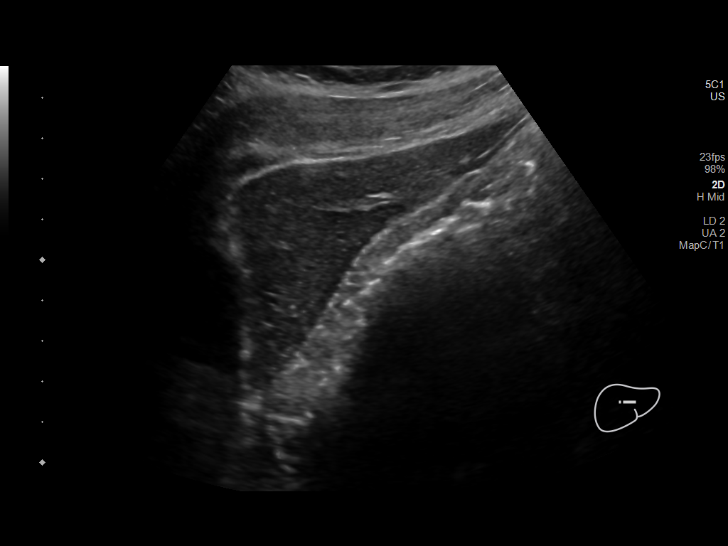
[im 32/85]
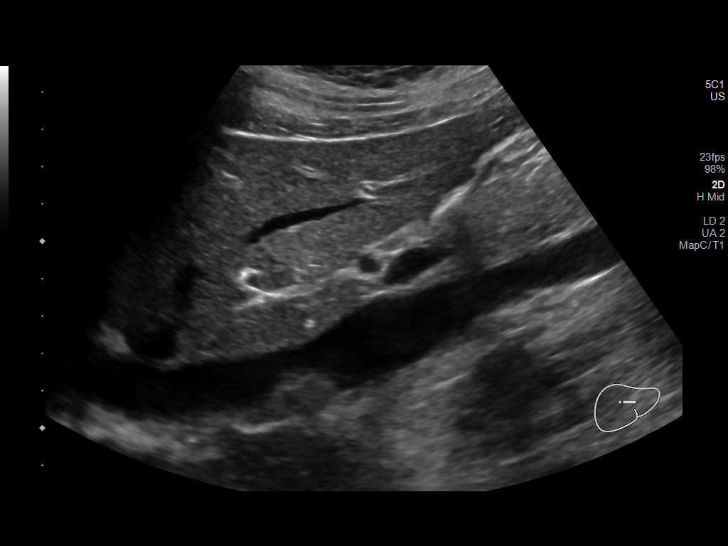
[im 39/85]
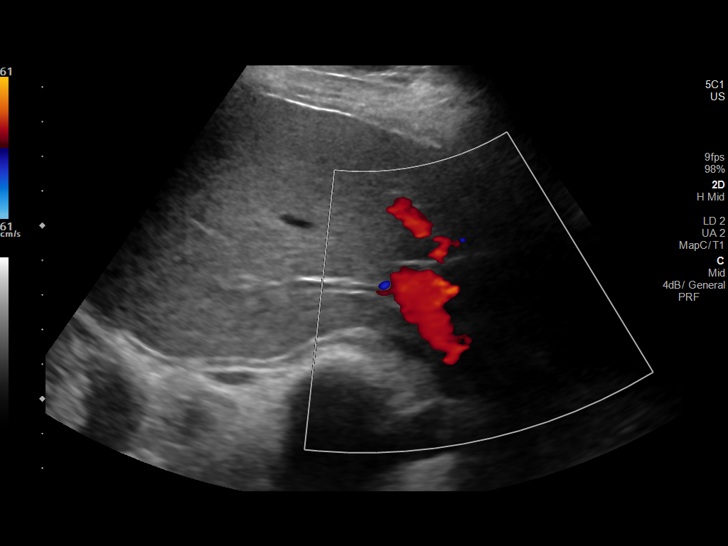
[im 46/85]
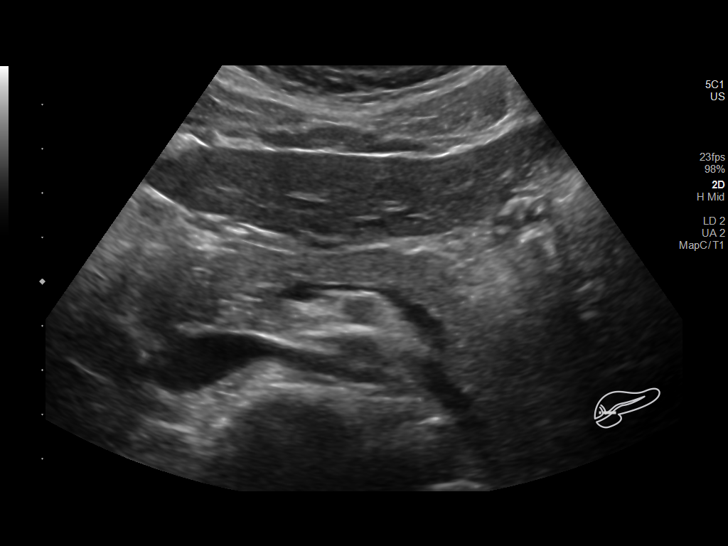
[im 53/85]
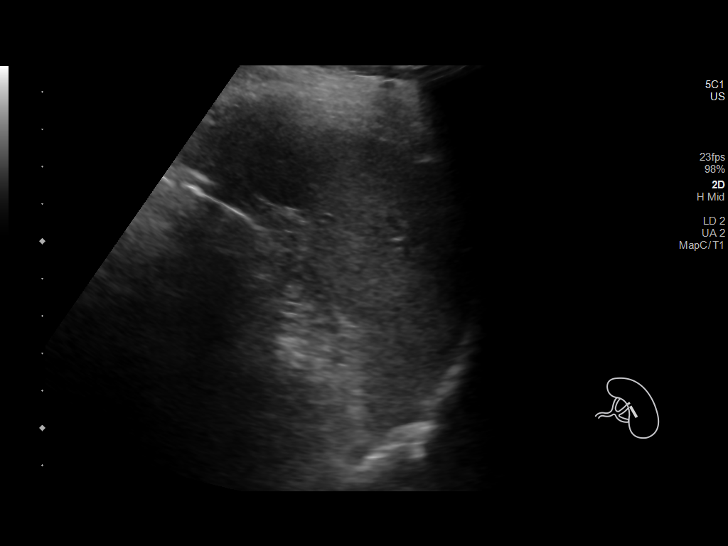
[im 57/85]
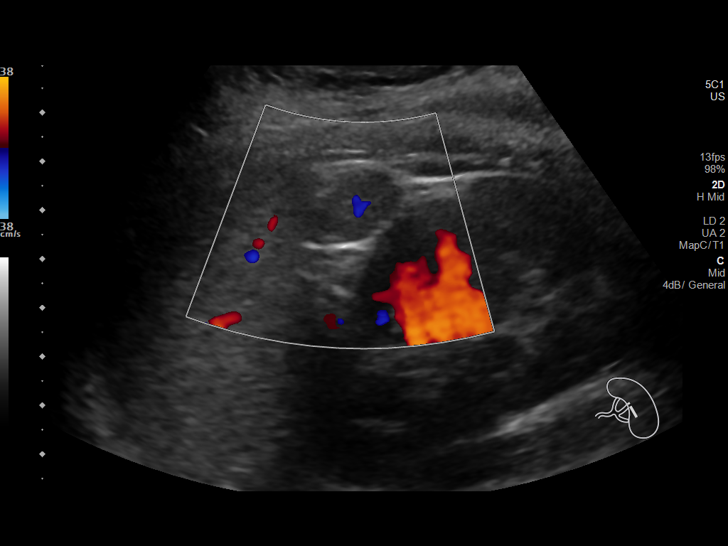
[im 64/85]
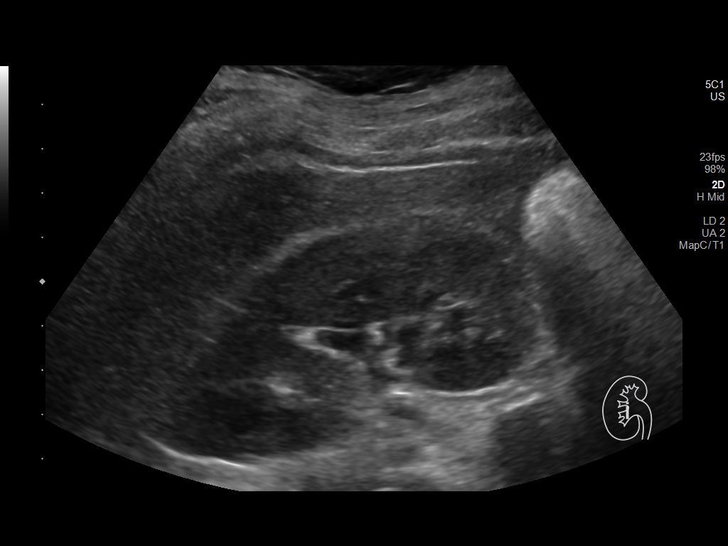
[im 71/85]
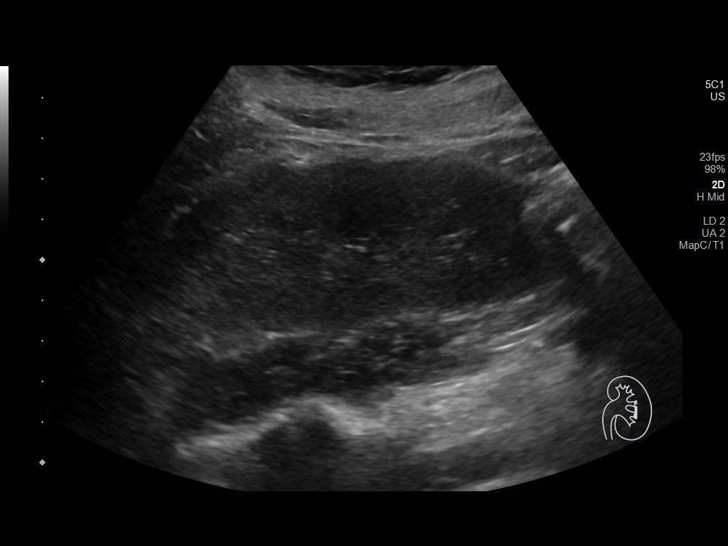
[im 78/85]
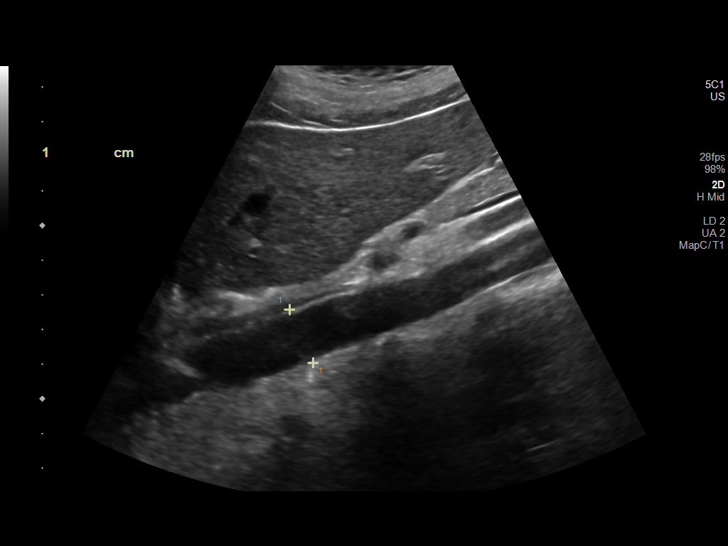
[im 85/85]
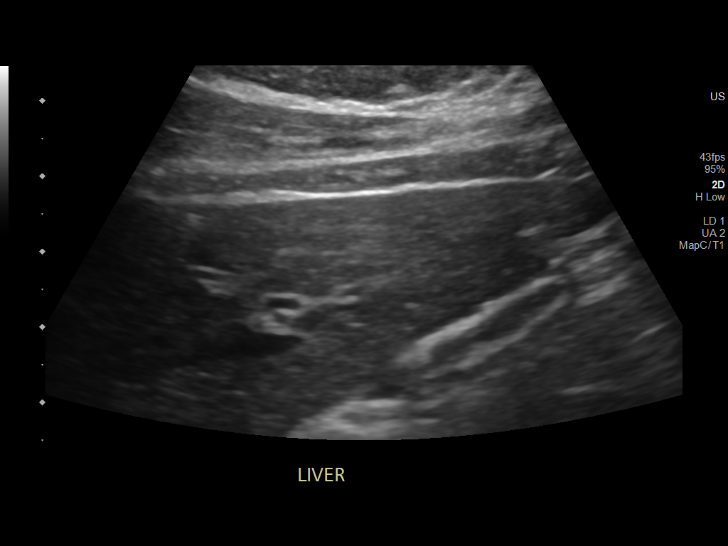

[14 of 25 positions shown; findings below may reference images not displayed]

FINDINGS: Gallbladder: No gallstones or wall thickening visualized. No
sonographic Murphy sign noted by sonographer.

Common bile duct: Diameter: Normal caliber, 3 mm

Liver: No focal lesion identified. Within normal limits in
parenchymal echogenicity. Portal vein is patent on color Doppler
imaging with normal direction of blood flow towards the liver.

IVC: No abnormality visualized.

Pancreas: Visualized portion unremarkable.

Spleen: Size and appearance within normal limits. 2.1 cm accessory
spleen noted

Right Kidney: Length: 10.5 cm. Echogenicity within normal limits. No
mass or hydronephrosis visualized.

Left Kidney: Length: 10.3 cm. Echogenicity within normal limits. No
mass or hydronephrosis visualized.

Abdominal aorta: No aneurysm visualized.

Other findings: None.
IMPRESSION: Unremarkable abdominal ultrasound.

## 2021-04-18 DIAGNOSIS — Z1389 Encounter for screening for other disorder: Secondary | ICD-10-CM | POA: Diagnosis not present

## 2021-09-05 DIAGNOSIS — Z0000 Encounter for general adult medical examination without abnormal findings: Secondary | ICD-10-CM | POA: Diagnosis not present

## 2021-09-05 DIAGNOSIS — H6123 Impacted cerumen, bilateral: Secondary | ICD-10-CM | POA: Diagnosis not present

## 2022-03-01 DIAGNOSIS — J019 Acute sinusitis, unspecified: Secondary | ICD-10-CM | POA: Diagnosis not present

## 2022-05-04 DIAGNOSIS — R631 Polydipsia: Secondary | ICD-10-CM | POA: Diagnosis not present

## 2022-05-04 DIAGNOSIS — R5383 Other fatigue: Secondary | ICD-10-CM | POA: Diagnosis not present

## 2022-05-04 DIAGNOSIS — R634 Abnormal weight loss: Secondary | ICD-10-CM | POA: Diagnosis not present

## 2022-07-29 DIAGNOSIS — H109 Unspecified conjunctivitis: Secondary | ICD-10-CM | POA: Diagnosis not present

## 2022-07-30 DIAGNOSIS — E860 Dehydration: Secondary | ICD-10-CM | POA: Diagnosis not present

## 2022-07-30 DIAGNOSIS — R0981 Nasal congestion: Secondary | ICD-10-CM | POA: Diagnosis not present

## 2022-07-30 DIAGNOSIS — J029 Acute pharyngitis, unspecified: Secondary | ICD-10-CM | POA: Diagnosis not present

## 2022-07-30 DIAGNOSIS — R509 Fever, unspecified: Secondary | ICD-10-CM | POA: Diagnosis not present

## 2022-08-02 DIAGNOSIS — D224 Melanocytic nevi of scalp and neck: Secondary | ICD-10-CM | POA: Diagnosis not present

## 2022-08-02 DIAGNOSIS — D2261 Melanocytic nevi of right upper limb, including shoulder: Secondary | ICD-10-CM | POA: Diagnosis not present

## 2022-08-02 DIAGNOSIS — D2262 Melanocytic nevi of left upper limb, including shoulder: Secondary | ICD-10-CM | POA: Diagnosis not present

## 2022-08-02 DIAGNOSIS — D225 Melanocytic nevi of trunk: Secondary | ICD-10-CM | POA: Diagnosis not present

## 2022-08-04 DIAGNOSIS — R051 Acute cough: Secondary | ICD-10-CM | POA: Diagnosis not present

## 2022-08-04 DIAGNOSIS — R0981 Nasal congestion: Secondary | ICD-10-CM | POA: Diagnosis not present

## 2022-08-04 DIAGNOSIS — J3489 Other specified disorders of nose and nasal sinuses: Secondary | ICD-10-CM | POA: Diagnosis not present

## 2022-08-04 DIAGNOSIS — H9203 Otalgia, bilateral: Secondary | ICD-10-CM | POA: Diagnosis not present

## 2022-10-19 DIAGNOSIS — Z0000 Encounter for general adult medical examination without abnormal findings: Secondary | ICD-10-CM | POA: Diagnosis not present

## 2022-10-19 DIAGNOSIS — L309 Dermatitis, unspecified: Secondary | ICD-10-CM | POA: Diagnosis not present

## 2022-12-20 DIAGNOSIS — Z01419 Encounter for gynecological examination (general) (routine) without abnormal findings: Secondary | ICD-10-CM | POA: Diagnosis not present

## 2022-12-20 DIAGNOSIS — Z6823 Body mass index (BMI) 23.0-23.9, adult: Secondary | ICD-10-CM | POA: Diagnosis not present

## 2022-12-20 DIAGNOSIS — Z124 Encounter for screening for malignant neoplasm of cervix: Secondary | ICD-10-CM | POA: Diagnosis not present

## 2023-02-20 DIAGNOSIS — L700 Acne vulgaris: Secondary | ICD-10-CM | POA: Diagnosis not present

## 2023-11-11 DIAGNOSIS — J309 Allergic rhinitis, unspecified: Secondary | ICD-10-CM | POA: Diagnosis not present

## 2023-11-11 DIAGNOSIS — F411 Generalized anxiety disorder: Secondary | ICD-10-CM | POA: Diagnosis not present

## 2023-11-11 DIAGNOSIS — Z0000 Encounter for general adult medical examination without abnormal findings: Secondary | ICD-10-CM | POA: Diagnosis not present

## 2023-11-11 DIAGNOSIS — R5383 Other fatigue: Secondary | ICD-10-CM | POA: Diagnosis not present

## 2023-11-18 DIAGNOSIS — J988 Other specified respiratory disorders: Secondary | ICD-10-CM | POA: Diagnosis not present

## 2023-12-04 DIAGNOSIS — R29898 Other symptoms and signs involving the musculoskeletal system: Secondary | ICD-10-CM | POA: Diagnosis not present

## 2024-01-09 DIAGNOSIS — Z124 Encounter for screening for malignant neoplasm of cervix: Secondary | ICD-10-CM | POA: Diagnosis not present

## 2024-01-09 DIAGNOSIS — Z01419 Encounter for gynecological examination (general) (routine) without abnormal findings: Secondary | ICD-10-CM | POA: Diagnosis not present

## 2024-01-09 DIAGNOSIS — Z6822 Body mass index (BMI) 22.0-22.9, adult: Secondary | ICD-10-CM | POA: Diagnosis not present

## 2024-02-13 DIAGNOSIS — E559 Vitamin D deficiency, unspecified: Secondary | ICD-10-CM | POA: Diagnosis not present

## 2024-05-02 ENCOUNTER — Other Ambulatory Visit: Payer: Self-pay

## 2024-05-02 ENCOUNTER — Emergency Department (HOSPITAL_COMMUNITY)
Admission: EM | Admit: 2024-05-02 | Discharge: 2024-05-02 | Disposition: A | Discharge status: Home / Self Care | Attending: Emergency Medicine | Admitting: Emergency Medicine

## 2024-05-02 DIAGNOSIS — J029 Acute pharyngitis, unspecified: Secondary | ICD-10-CM | POA: Diagnosis not present

## 2024-05-02 LAB — CBC WITH DIFFERENTIAL/PLATELET
Abs Immature Granulocytes: 0.03 K/uL (ref 0.00–0.07)
Basophils Absolute: 0 K/uL (ref 0.0–0.1)
Basophils Relative: 0 %
Eosinophils Absolute: 0.1 K/uL (ref 0.0–0.5)
Eosinophils Relative: 1 %
HCT: 37.6 % (ref 36.0–46.0)
Hemoglobin: 11.9 g/dL — ABNORMAL LOW (ref 12.0–15.0)
Immature Granulocytes: 0 %
Lymphocytes Relative: 13 %
Lymphs Abs: 1.4 K/uL (ref 0.7–4.0)
MCH: 28.7 pg (ref 26.0–34.0)
MCHC: 31.6 g/dL (ref 30.0–36.0)
MCV: 90.6 fL (ref 80.0–100.0)
Monocytes Absolute: 0.7 K/uL (ref 0.1–1.0)
Monocytes Relative: 6 %
Neutro Abs: 9 K/uL — ABNORMAL HIGH (ref 1.7–7.7)
Neutrophils Relative %: 80 %
Platelets: 272 K/uL (ref 150–400)
RBC: 4.15 MIL/uL (ref 3.87–5.11)
RDW: 12.9 % (ref 11.5–15.5)
WBC: 11.2 K/uL — ABNORMAL HIGH (ref 4.0–10.5)
nRBC: 0 % (ref 0.0–0.2)

## 2024-05-02 LAB — GROUP A STREP BY PCR: Group A Strep by PCR: NOT DETECTED

## 2024-05-02 LAB — URINALYSIS, ROUTINE W REFLEX MICROSCOPIC
Bilirubin Urine: NEGATIVE
Glucose, UA: NEGATIVE mg/dL
Ketones, ur: NEGATIVE mg/dL
Nitrite: NEGATIVE
Protein, ur: NEGATIVE mg/dL
Specific Gravity, Urine: 1.003 — ABNORMAL LOW (ref 1.005–1.030)
pH: 7 (ref 5.0–8.0)

## 2024-05-02 LAB — BASIC METABOLIC PANEL WITH GFR
Anion gap: 13 (ref 5–15)
BUN: 10 mg/dL (ref 6–20)
CO2: 21 mmol/L — ABNORMAL LOW (ref 22–32)
Calcium: 9.5 mg/dL (ref 8.9–10.3)
Chloride: 105 mmol/L (ref 98–111)
Creatinine, Ser: 0.8 mg/dL (ref 0.44–1.00)
GFR, Estimated: >60 mL/min (ref >60)
Glucose, Bld: 92 mg/dL (ref 70–99)
Potassium: 3.8 mmol/L (ref 3.5–5.1)
Sodium: 138 mmol/L (ref 135–145)

## 2024-05-02 LAB — RESP PANEL BY RT-PCR (RSV, FLU A&B, COVID)  RVPGX2
Influenza A by PCR: NEGATIVE
Influenza B by PCR: NEGATIVE
Resp Syncytial Virus by PCR: NEGATIVE
SARS Coronavirus 2 by RT PCR: NEGATIVE

## 2024-05-02 LAB — HCG, SERUM, QUALITATIVE: Preg, Serum: NEGATIVE

## 2024-05-02 MED ORDER — PREDNISONE 10 MG PO TABS: 40.0000 mg | ORAL_TABLET | Freq: Every day | ORAL | 0 refills | Status: AC

## 2024-05-02 MED ORDER — EPINEPHRINE 0.3 MG/0.3ML IJ SOAJ: 0.3000 mg | INTRAMUSCULAR | 0 refills | Status: AC | PRN

## 2024-05-02 MED ORDER — DIPHENHYDRAMINE HCL 50 MG/ML IJ SOLN
50.0000 mg | Freq: Once | INTRAMUSCULAR | Status: AC
  Administered 2024-05-02: 25 mg via INTRAVENOUS
  Filled 2024-05-02: qty 1

## 2024-05-02 MED ORDER — SODIUM CHLORIDE 0.9 % IV BOLUS
1000.0000 mL | Freq: Once | INTRAVENOUS | Status: AC
  Administered 2024-05-02: 1000 mL via INTRAVENOUS

## 2024-05-02 NOTE — ED Notes (Signed)
 Discharge instructions, medications, and follow up care reviewed with and provided to pt. Pt denies any further questions, and has verbalized understanding.

## 2024-05-02 NOTE — ED Triage Notes (Signed)
 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 Pt reports a scratchy feeling in her throat that began mildly last night, and has worsened. Pt feels the urge to clear her throat frequently, and drink frequently. PT denies exposure to any known allergens.   Pt states that she had her vitals taken twice before coming, and her BP was labile.   PT appears to be in no distress, No hives noted.

## 2024-05-02 NOTE — ED Provider Notes (Signed)
 [place] EMERGENCY DEPARTMENT AT [facility] Provider Note   CSN: [id number] Arrival date & time: 05/02/24  [id number]     Patient presents with: Sore Throat   Angela Rogers is a 28 y.o. female.   28 year old female with prior medical history as detailed below presents for evaluation.  Patient reports that she woke up this morning with a scratchy painful sore throat.  She presented to the hospital today for work.  She is a international aid/development worker.  While at work she felt like her throat pain became worse.  She felt the urge to clear her throat frequently.  She was up to talk to her students, when she felt flushed and tachycardic.  She denies rash.  She denies difficulty breathing.  She denies fever.  She did not take anything for her symptoms prior to coming to the ED.  The history is provided by the patient and medical records.       Prior to Admission medications   Medication Sig Start Date End Date Taking? Authorizing Provider  Prenatal Vit-Fe Fumarate-FA (PRENATAL MULTIVITAMIN) TABS tablet Take 1 tablet by mouth daily at 12 noon.    [provider]    Allergies: Penicillins, Chlorhexidine, and Povidone iodine    Review of Systems  Constitutional:  Negative for chills and fever.  HENT:  Negative for ear pain and sore throat.   Eyes:  Negative for pain and visual disturbance.  Respiratory:  Negative for cough and shortness of breath.   Cardiovascular:  Negative for chest pain and palpitations.  Gastrointestinal:  Negative for abdominal pain and vomiting.  Genitourinary:  Negative for dysuria and hematuria.  Musculoskeletal:  Negative for arthralgias and back pain.  Skin:  Negative for color change and rash.  Neurological:  Negative for seizures and syncope.  All other systems reviewed and are negative.   Updated Vital Signs BP 114/77 (BP Location: Left Arm)   Pulse 95   Temp 98.2 F (36.8 C) (Oral)   Resp 20   LMP 04/24/2024 (Approximate)   SpO2  100%   Physical Exam Vitals and nursing note reviewed.  Constitutional:      General: She is not in acute distress.    Appearance: She is well-developed.  HENT:     Head: Normocephalic and atraumatic.     Mouth/Throat:     Mouth: Mucous membranes are dry.     Pharynx: Posterior oropharyngeal erythema present. No oropharyngeal exudate.  Eyes:     Conjunctiva/sclera: Conjunctivae normal.  Cardiovascular:     Rate and Rhythm: Normal rate and regular rhythm.     Heart sounds: No murmur heard. Pulmonary:     Effort: Pulmonary effort is normal. No respiratory distress.     Breath sounds: Normal breath sounds.  Abdominal:     Palpations: Abdomen is soft.     Tenderness: There is no abdominal tenderness.  Musculoskeletal:        General: No swelling.     Cervical back: Neck supple.  Skin:    General: Skin is warm and dry.     Capillary Refill: Capillary refill takes less than 2 seconds.  Neurological:     Mental Status: She is alert.  Psychiatric:        Mood and Affect: Mood normal.     (all labs ordered are listed, but only abnormal results are displayed) Labs Reviewed  GROUP A STREP BY PCR  RESP PANEL BY RT-PCR (RSV, FLU A&B, COVID)  [id number]  CBC  WITH DIFFERENTIAL/PLATELET  BASIC METABOLIC PANEL WITH GFR  HCG, SERUM, QUALITATIVE  URINALYSIS, ROUTINE W REFLEX MICROSCOPIC    EKG: None  Radiology: No results found.   Procedures   Medications Ordered in the ED  diphenhydrAMINE  (BENADRYL ) injection 50 mg (has no administration in time range)  sodium chloride  0.9 % bolus 1,000 mL (has no administration in time range)                                    Medical Decision Making Patient presents after acute onset of sore throat and tightness of the throat.  Patient is concerned that her symptoms may be related to possible allergic reaction.  With the Benadryl  patient's symptoms are resolved completely.  Screening labs obtained are without significant acute  abnormality.  Patient provided with a prescription for EpiPen and prednisone burst.    Screening labs obtained are without significant acute abnormality.  At time of discharge she feels improved.  Importance of close follow-up stressed.  Strict return precautions given and understood.  Amount and/or Complexity of Data Reviewed Labs: ordered.  Risk Prescription drug management.        Final diagnoses:  Sore throat    ED Discharge Orders          Ordered    predniSONE (DELTASONE) 10 MG tablet  Daily        05/02/24 1126    EPINEPHrine 0.3 mg/0.3 mL IJ SOAJ injection  As needed        05/02/24 1126               Laurice Maude BROCKS, MD 05/02/24 1237

## 2024-05-02 NOTE — Discharge Instructions (Signed)
 Return for any problem.  ?
